# Patient Record
Sex: Female | Born: 1955 | Hispanic: Yes | Marital: Single | Smoking: Former smoker
Health system: Southern US, Community
[De-identification: ages and names within clinical notes are randomized; demographics above are authoritative.]

## PROBLEM LIST (undated history)

## (undated) DIAGNOSIS — J45909 Unspecified asthma, uncomplicated: Secondary | ICD-10-CM

## (undated) DIAGNOSIS — C801 Malignant (primary) neoplasm, unspecified: Secondary | ICD-10-CM

## (undated) DIAGNOSIS — I10 Essential (primary) hypertension: Secondary | ICD-10-CM

## (undated) DIAGNOSIS — N189 Chronic kidney disease, unspecified: Secondary | ICD-10-CM

## (undated) DIAGNOSIS — R7303 Prediabetes: Secondary | ICD-10-CM

## (undated) HISTORY — PX: KIDNEY SURGERY: SHX687

## (undated) HISTORY — PX: ABDOMINAL HYSTERECTOMY: SHX81

## (undated) HISTORY — PX: TOTAL NEPHRECTOMY: SHX415

---

## 2019-07-08 ENCOUNTER — Ambulatory Visit (HOSPITAL_COMMUNITY): Payer: Self-pay | Admitting: Surgery

## 2019-11-13 ENCOUNTER — Other Ambulatory Visit (HOSPITAL_COMMUNITY): Payer: Self-pay | Admitting: Surgery

## 2019-11-13 ENCOUNTER — Other Ambulatory Visit: Payer: Self-pay | Admitting: Surgery

## 2019-11-13 DIAGNOSIS — K432 Incisional hernia without obstruction or gangrene: Secondary | ICD-10-CM

## 2019-11-24 ENCOUNTER — Ambulatory Visit: Payer: Self-pay | Admitting: Surgery

## 2019-11-24 DIAGNOSIS — Z789 Other specified health status: Secondary | ICD-10-CM | POA: Insufficient documentation

## 2019-11-24 DIAGNOSIS — Z905 Acquired absence of kidney: Secondary | ICD-10-CM | POA: Insufficient documentation

## 2019-11-24 DIAGNOSIS — K432 Incisional hernia without obstruction or gangrene: Secondary | ICD-10-CM | POA: Insufficient documentation

## 2019-11-24 DIAGNOSIS — Z8544 Personal history of malignant neoplasm of other female genital organs: Secondary | ICD-10-CM

## 2019-11-24 DIAGNOSIS — E669 Obesity, unspecified: Secondary | ICD-10-CM | POA: Insufficient documentation

## 2019-11-25 ENCOUNTER — Ambulatory Visit: Payer: Self-pay | Admitting: Surgery

## 2019-11-25 NOTE — H&P (Signed)
Jody Fitzgerald Appointment: 07/08/2019 10:15 AM Location: [facility] Patient #: [id number] DOB: [birth year] Divorced / Language: Undefined / Race: Refused to Report/Unreported Female   History of Present Illness Adin Hector MD; 07/08/2019 1:22 PM) The patient is a 64 year old female who presents with abdominal pain. Note for "Abdominal pain": ` ` ` Patient sent for surgical consultation at the request of [name]  Chief Complaint: Abdominal pain had old kidney incision. Concern for hernia. ` ` The patient is a morbidly obese woman. She comes in today by herself. She speaks no [place].. Used a telephone interpreter help sort out. No records were available at first. After calling her office numerous times, able to get some of her records. She mainly been [place]. She had some type of GYN surgery. 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 6 months later she had a right nephrectomy and possible partial hepatectomy. That was done through a right subcostal incision. Patient noted worsening pulling and discomfort while she was visiting her daughter at the [place]. She has been in [place] for 3 years. She had a CAT scan which showed concern of splenic flexure and descending colon going any hernia in the right upper quadrant. No obstruction. She discussed with her primary care physician. Surgical consultation offered. She's had some other abdominal complaints. Had an ultrasound that was underwhelming. Had CT of the chest that showed no metastatic disease. She gets short of breath with exertion. Apparently has some left ventricular hypertrophy. Due to see Dr. Brigitte Pulse with cardiology. Has hypertension. Initially poorly controlled with seems better now. At some point had Covid as by reactive antibody testing last month. Has borderline elevated hemoglobin A1c. She is due to get screening for colon cancer by gastroenterology. History of  asthma and wheezing. She used to smoke but does not smoke anymore. No history of skin infections or wound infections with her is that she can recall. She moves her bowels about every other day. She is not on blood thinners. Can walk about 10-15 minutes before she has to stop. She has not had episodes of nausea or vomiting.  (Review of systems as stated in this history (HPI) or in the review of systems. Otherwise all other 12 point ROS are negative) ` ` `  This patient encounter took 120 minutes today to perform the following: obtain history, perform exam, review outside records, interpret tests & imaging, counsel the patient on their diagnosis; and, document this encounter, including findings & plan in the electronic health record (EHR).   Past Surgical History Illene Fitzgerald, CMA; 07/08/2019 11:17 AM) Hysterectomy (due to cancer) - Complete  Nephrectomy  Right.  Diagnostic Studies History Illene Fitzgerald, CMA; 07/08/2019 11:17 AM) Colonoscopy  >10 years ago Mammogram  >3 years ago Pap Smear  >5 years ago  Allergies Illene Fitzgerald, CMA; 07/08/2019 11:18 AM) No Known Drug Allergies  [07/08/2019]:  Social History Illene Fitzgerald, CMA; 07/08/2019 11:17 AM) Alcohol use  Remotely quit alcohol use. Caffeine use  Coffee. No drug use  Tobacco use  Former smoker.  Family History Illene Fitzgerald, CMA; 07/08/2019 11:17 AM) Diabetes Mellitus  Brother, Family Members In General, Father, Sister. Hypertension  Brother, Mother, Sister. Kidney Disease  Sister.  Pregnancy / Birth History Illene Fitzgerald, CMA; 07/08/2019 11:17 AM) Age at menarche  18 years. Age of menopause  10-50 Gravida  9 Length (months) of breastfeeding  12-24 Maternal age  <15 Para  6 Regular periods   Other  Problems Jody Fitzgerald, CMA; 07/08/2019 11:17 AM) Anxiety Disorder  Asthma  Bladder Problems  Cancer  High blood pressure     Review of Systems (Jody Fitzgerald CMA; 07/08/2019  11:17 AM) General Present- Weight Gain. Not Present- Appetite Loss, Chills, Fatigue, Fever, Night Sweats and Weight Loss. Skin Not Present- Change in Wart/Mole, Dryness, Hives, Jaundice, New Lesions, Non-Healing Wounds, Rash and Ulcer. HEENT Present- Wears glasses/contact lenses. Not Present- Earache, Hearing Loss, Hoarseness, Nose Bleed, Oral Ulcers, Ringing in the Ears, Seasonal Allergies, Sinus Pain, Sore Throat, Visual Disturbances and Yellow Eyes. Respiratory Present- Snoring. Not Present- Bloody sputum, Chronic Cough, Difficulty Breathing and Wheezing. Cardiovascular Not Present- Chest Pain, Difficulty Breathing Lying Down, Leg Cramps, Palpitations, Rapid Heart Rate, Shortness of Breath and Swelling of Extremities. Gastrointestinal Not Present- Abdominal Pain, Bloating, Bloody Stool, Change in Bowel Habits, Chronic diarrhea, Constipation, Difficulty Swallowing, Excessive gas, Gets full quickly at meals, Hemorrhoids, Indigestion, Nausea, Rectal Pain and Vomiting. Female Genitourinary Present- Nocturia and Urgency. Not Present- Frequency, Painful Urination and Pelvic Pain. Musculoskeletal Not Present- Back Pain, Joint Pain, Joint Stiffness, Muscle Pain, Muscle Weakness and Swelling of Extremities. Neurological Not Present- Decreased Memory, Fainting, Headaches, Numbness, Seizures, Tingling, Tremor, Trouble walking and Weakness. Psychiatric Not Present- Anxiety, Bipolar, Change in Sleep Pattern, Depression, Fearful and Frequent crying. Endocrine Present- Cold Intolerance. Not Present- Excessive Hunger, Hair Changes, Heat Intolerance, Hot flashes and New Diabetes. Hematology Present- Blood Thinners. Not Present- Easy Bruising, Excessive bleeding, Gland problems, HIV and Persistent Infections.  Vitals (Jody Fitzgerald CMA; 07/08/2019 11:18 AM) 07/08/2019 11:18 AM Weight: 245 lb Height: 60in Body Surface Area: 2.03 m Body Mass Index: 47.85 kg/m  Pulse: 92 (Regular)  BP: 110/70(Sitting, Left  Arm, Standard)       Physical Exam Adin Hector MD; 07/08/2019 1:12 PM) General Mental Status-Alert. General Appearance-Not in acute distress, Not Sickly. Orientation-Oriented X3. Hydration-Well hydrated. Voice-Normal.  Integumentary Global Assessment Upon inspection and palpation of skin surfaces of the - Axillae: non-tender, no inflammation or ulceration, no drainage. and Distribution of scalp and body hair is normal. General Characteristics Temperature - normal warmth is noted.  Head and Neck Head-normocephalic, atraumatic with no lesions or palpable masses. Face Global Assessment - atraumatic, no absence of expression. Neck Global Assessment - no abnormal movements, no bruit auscultated on the right, no bruit auscultated on the left, no decreased range of motion, non-tender. Trachea-midline. Thyroid Gland Characteristics - non-tender.  Eye Eyeball - Left-Extraocular movements intact, No Nystagmus - Left. Eyeball - Right-Extraocular movements intact, No Nystagmus - Right. Cornea - Left-No Hazy - Left. Cornea - Right-No Hazy - Right. Sclera/Conjunctiva - Left-No scleral icterus, No Discharge - Left. Sclera/Conjunctiva - Right-No scleral icterus, No Discharge - Right. Pupil - Left-Direct reaction to light normal. Pupil - Right-Direct reaction to light normal.  ENMT Ears Pinna - Left - no drainage observed, no generalized tenderness observed. Pinna - Right - no drainage observed, no generalized tenderness observed. Nose and Sinuses External Inspection of the Nose - no destructive lesion observed. Inspection of the nares - Left - quiet respiration. Inspection of the nares - Right - quiet respiration. Mouth and Throat Lips - Upper Lip - no fissures observed, no pallor noted. Lower Lip - no fissures observed, no pallor noted. Nasopharynx - no discharge present. Oral Cavity/Oropharynx - Tongue - no dryness observed. Oral Mucosa - no cyanosis  observed. Hypopharynx - no evidence of airway distress observed.  Chest and Lung Exam Inspection Movements - Normal and Symmetrical. Accessory muscles - No use  of accessory muscles in breathing. Palpation Palpation of the chest reveals - Non-tender. Auscultation Breath sounds - Normal and Clear.  Cardiovascular Auscultation Rhythm - Regular. Murmurs & Other Heart Sounds - Auscultation of the heart reveals - No Murmurs and No Systolic Clicks.  Abdomen Inspection Inspection of the abdomen reveals - No Visible peristalsis and No Abnormal pulsations. Umbilicus - No Bleeding, No Urine drainage. Palpation/Percussion Palpation and Percussion of the abdomen reveal - Soft, Non Tender, No Rebound tenderness, No Rigidity (guarding) and No Cutaneous hyperesthesia. Note: Couple body habitus. Hopefully obese. Right upper flank subcostal incision with some bulging and discomfort had anterior axillary line about 2 cm inferior to the costal ridge. Seems concerning for a incisional hernia. Seems reducible. Hard to tell with her thick abdominal wall Abdomen soft. Not severely distended. No diastasis recti. No other anterior abdominal wall hernias   Female Genitourinary Sexual Maturity Tanner 5 - Adult hair pattern. Note: No vaginal bleeding nor discharge   Peripheral Vascular Upper Extremity Inspection - Left - No Cyanotic nailbeds - Left, Not Ischemic. Inspection - Right - No Cyanotic nailbeds - Right, Not Ischemic.  Neurologic Neurologic evaluation reveals -normal attention span and ability to concentrate, able to name objects and repeat phrases. Appropriate fund of knowledge , normal sensation and normal coordination. Mental Status Affect - not angry, not paranoid. Cranial Nerves-Normal Bilaterally. Gait-Normal.  Neuropsychiatric Mental status exam performed with findings of-able to articulate well with normal speech/language, rate, volume and coherence, thought content normal  with ability to perform basic computations and apply abstract reasoning and no evidence of hallucinations, delusions, obsessions or homicidal/suicidal ideation.  Musculoskeletal Global Assessment Spine, Ribs and Pelvis - no instability, subluxation or laxity. Right Upper Extremity - no instability, subluxation or laxity.  Lymphatic Head & Neck  General Head & Neck Lymphatics: Bilateral - Description - No Localized lymphadenopathy. Axillary  General Axillary Region: Bilateral - Description - No Localized lymphadenopathy. Femoral & Inguinal  Generalized Femoral & Inguinal Lymphatics: Left - Description - No Localized lymphadenopathy. Right - Description - No Localized lymphadenopathy.    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  As best I can gather, she most likely has an incisional hernia involving right nephrectomy incision. It does seem reducible but hard to tell.  There is a CAT scan reports that talks about incisional hernia.  Report talks about the splenic flexure and descending colon is in this right flank hernia. That does not make sense.  Vital long discussion with the patient through an interpreter. We call her primary care office many times. Took 2-1/2 hours to work through all this and still insufficient.  After explaining many 3 due to interpreters, tried to convince the patient to help [place] by getting records. She insists that primary office has all her records from when she was up and [place]. He will be variable to have that. He did not why she had a nephrectomy and if she is cancer free.  I also need to have the actual images. It seems like she did have a CT of the actual CAT scan from [place]. I tried to go through care everywhere, but there are no records available. I need to strictures. Otherwise, we need to  repeat the CT scan of the abdomen. She's had an ultrasound and CT chest on her underwhelming. No mention of hernia as opposed to the 1 in [place] that noted it.  Should she have an incisional hernia that involves colon, she may benefit from surgery to repair this. Laparoscopic approach with the decubitus positioning. However her morbid obesity makes risk of recurrence rather high. A hold off on any aggressive intervention given the morbidity. Would want medical probably cardiac clearance first.  After very long discussion I gave her a handout that talks about incisional hernias in Spanish. We strongly recommend that she have her daughter with her to help interpret and support her through this. Again strongly recommend that the patient can get all her records for [place] to help sort out what is going on. Current Plans Follow Up - Call [facility] office after tests / studies doneto discuss further plans Pt Education - Pamphlet Given - Hernia Surgery: discussed with patient and provided information. USES SPANISH AS PRIMARY SPOKEN LANGUAGE (Z78.9) Impression: Spent over 75 minutes using an interpreter talking with the patient. 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 She describes kidney stones getting so large that part of the liver had to be removed. The sounds more like a renal cell that to me. Another possibility is need for right nephrectomy after hysterectomy for cancer due to ureteral stricture/injury. Copy of operative warts essential to sort out what happened. 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  Looks like her vaginal cancer and kidney resections were over a decade  ago.  While I do not have the actual films, there is a report from earlier this year have a CAT scan showing incisional hernia along right nephrectomy incision.  No obvious cancer recurrence.  Therefore I can offer incisional hernia repair.  Laparoscopic approach.  Most likely will need a large sheet of mesh given her obesity a moderately large-sized defect.  Most likely overnight stay at the very least.  I think he would be very helpful to have family with her especially since I believe her children or niece is bilingual.  I will help facilitate communication.  I Have the patient and family come to the office to discuss surgery again if they wish.  I noted the patient's been strongly motivated to get surgery done.  > From: Illene Fitzgerald CMA > To: Adin Hector MD > Sent: 11/24/2019 10:01 AM > Dr Johney Maine has reviewed the outside rec's from [facility] and states that we can proceed with getting the pt scheduled for surgery now for the incisional hernia repair. Per Dr Johney Maine the pt doesn't need a repeat CT scan that we had ordered since now reviewing the rec's the pt had a recent CT this year. The son Mallie Mussel has been notified and is aware that we are going to turn sx orders into scheduling w/his name on the sx orders as a contact to schedule the pt's sx. The son Mallie Mussel is aware.  > From: Illene Fitzgerald CMA > To: Illene Fitzgerald CMA > Sent: 11/21/2019 9:04 AM > Received rec's from [facility] by mail will show to DR Lailoni Baquera on Monday for review then will decide on plan.  > From: Illene Fitzgerald CMA > To: Illene Fitzgerald CMA > Sent: 11/13/2019 3:56 PM > Charlene w/[facility] [phone] (  [phone]) returned my call. I explained the situation to her and she is going to try to help get [place] the rec's. Dr Johney Maine was sitting beside me while I was on the phone explaining how hard this has been trying to get rec's on this referral from their office that was sent over in  June. Randell Patient is going to mail me o.v. notes, CT reports, and what info she see's from [place] that the pt brought in to their office. I requested for her to mail the information to my attn here at [place].  > From: Illene Fitzgerald CMA > To: Illene Fitzgerald CMA > Sent: 11/13/2019 2:51 PM > Left another message w/DR Ronnald Ramp office asking for the supervisor to call me back or someone. We leave messages and no one calls [place] back. stating that i have been trying to get rec's from their office for months now on this pt that they referred to Dr Johney Maine several months a go we can't give a surgical recommendation till they give [place] her rec's from there. The pt is calling our office upset b/c she is wanting to know if she needs sx or not. We have tried multiple times calling [facility] and the pt has even tried herself. We need all rec's from Dr Ronnald Ramp, CT report, and outside rec's that has any operative reports that may help explain what other surgeries the pt has had done.  > From: Illene Fitzgerald CMA > To: Illene Fitzgerald CMA > Sent: 11/12/2019 9:53 AM > Called pt's son again to advise him that we have tried several times reaching out to [facility] about getting the pt's rec's that pertain to the referral that was sent over here on the incisional hernia. We have tried explaining to his mom also that Dr Johney Maine is needing more information in order to give her a surgical recommendation. I know the pt has been calling wanting to get scheduled for sx but I explained that Dr Johney Maine has not given the pt a surgical opinion yet b/c she is complicated. I advised that I know the pt has brought some rec's into the office but they are not from the pcp like we have advised. I know the pt has been cleared by cardiology but it's not cardiology rec's that we need it's the pcp Dr Ronnald Ramp. I advised that Dr Johney Maine did say that we could just order another CT scan on the pt to see about this incisional hernia. I did  place a CT A/P w/contrast and marked for them to call the son Mallie Mussel since the pt speaks spanish. Mallie Mussel understands.  > From: Oretha Ellis  > To: Illene Fitzgerald CMA > Sent: 11/06/2019 12:17 PM > Pt's son, Mallie Mussel, returned call. Please call back at your convenience. Thank you, Rosendo Gros  > From: Illene Fitzgerald CMA > To: Illene Fitzgerald CMA > Sent: 11/06/2019 9:15 AM > LMOm for pt's son Mallie Mussel to call me back.  > From: Jackquline Denmark  > To: Illene Fitzgerald CMA > Sent: 11/06/2019 8:20 AM > sent this to myself  > From: Jackquline Denmark  > To: Jackquline Denmark  > Sent: 11/05/2019 4:57 PM > Mallie Mussel Mike's phone#[phone]  > From: Jackquline Denmark  > To: Illene Fitzgerald CMA > Sent: 11/05/2019 4:55 PM > Pt's son, Getsemani Lindon, calling to schedule his mother's sx, please call.  > From: Illene Fitzgerald CMA > To: Illene Fitzgerald CMA > Sent: 10/27/2019 10:00 AM > Called pt's pcp Dr  Jones office spoke to [place] about what we are needing on this pt in order to get the pt a surgical opinion. I advised Tiffany that we need any of their office notes, CT reports, and any outside rec's like operative reports that would help explain some of the pt's medical hx. Tiffany will try to work on this today and either email the rec's vs. fax them to [place].   I asked for Earnest Bailey to call the pt to give her a update on what we are working on in the time being. Pt is aware.  > From: Illene Fitzgerald CMA > To: Illene Fitzgerald CMA > Sent: 10/24/2019 3:36 PM > LMom w/[facility] to call me back. It's very important that I speak w/someone over there to explain what rec's Dr Johney Maine is needing for this pt.  > From: Jackquline Denmark  > To: Illene Fitzgerald CMA > Sent: 10/24/2019 3:06 PM > Pt dropped off records from [place], [place] made copies, they are up at the front desk.  > From: Illene Fitzgerald CMA > To: Illene Fitzgerald CMA > Sent: 10/23/2019 11:11 AM > Returned call to [place] but had to [facility] for her to  call me back. I need to speak w/Carmen when she calls back.  > From: Jackquline Denmark  > To: Illene Fitzgerald CMA > Sent: 10/23/2019 8:54 AM > Asencion Partridge, [facility], called regarding this pt, she states the pt called and told our office has not received the clearance for her sx, you can reach her at [phone].   > From: Illene Fitzgerald CMA > To: Illene Fitzgerald CMA > Sent: 10/22/2019 3:20 PM > Earnest Bailey w/our office called pt to let her know the recommendations below by Dr Johney Maine. Earnest Bailey explained to the pt that she needs to get the rec's from [facility] and her outside rec's from [place] if pt wishes to have a surgical evaluation. Pt advised that when she gets the rec's sent to [place] then we can move forward with some of the other request listed below. Pt is aware.  > From: Adin Hector MD > To: Illene Fitzgerald CMA > Sent: 10/20/2019 12:39 PM > While the patient is insistent on getting surgery, that doesn't mean that I'm going to consider it until the workup is complete.  While we have a cardiac clearance note from [facility], I think it would be wise to have a copy of their echocardiogram report from June 2021.  We still have no records from the primary care physician or from [place] that would help explain why she had kidney and GYN surgery.  I need records from her prior surgeries up in [place].  The patient has insisted that [facility] has this.  We have not gotten those records.  Would like to actually see the operative reports to know what was done.  Sounds like she had a hysterectomy and then a nephrectomy.  I have reviewed the CT scan.  I see no report.  Just the disc.  It would be nice to know what the official read is.  I cannot pull it up in the PACS system to work around that.  The CT scan is of the chest only.  I can see the upper part of a right flank hernia that seems to correlate with her prior right nephrectomy, but it is not  complete.  I think the patient will benefit from a CT scan of the abdomen and pelvis with  oral and IV contrast to help delineate the full hernia for possible planning.    Would be helpful to make sure that she has no metastatic disease or other issues since she's had 2 surgeries for GYN and renal tumors (?cancers).    It would be helpful to know from primary care office if she's had any follow-up with medical oncology and they are not concerns about recurrence or need for chemotherapy, etc.  If the CAT scan shows no surprises, she does not require any further oncology follow-up/chemotherapy, we get records of last PCP visit, copies of her prior surgeries up in [place]; then, we can offer her to return to the office with family to discuss surgery repair with a family member there since she speaks no [place].  Suspect she will need a large sheet of mesh given her obesity and the size of this hernia.  If this is not acceptable to the patient, she can get a opinion with someone else  > From: Illene Fitzgerald CMA > To: Adin Hector MD > Sent: 10/17/2019 3:56 PM > Please advise.  We did get a cd mailed to the office today for you to review of CT chest.  > From: Cipriano Mile  > To: Illene Fitzgerald CMA > Sent: 10/17/2019 12:34 PM > Pt is calling to schedule sx.  I don't have any orders.

## 2020-02-05 ENCOUNTER — Encounter (HOSPITAL_COMMUNITY): Payer: Self-pay

## 2020-02-05 NOTE — Patient Instructions (Addendum)
DUE TO COVID-19 ONLY ONE VISITOR IS ALLOWED TO COME WITH YOU AND STAY IN THE WAITING ROOM ONLY DURING PRE OP AND PROCEDURE DAY OF SURGERY. THE 1 VISITOR  MAY VISIT WITH YOU AFTER SURGERY IN YOUR PRIVATE ROOM DURING VISITING HOURS ONLY!  YOU NEED TO HAVE A COVID 19 TEST ON__1/15_____ @12 :00 PM_______, THIS TEST MUST BE DONE BEFORE SURGERY,  COVID TESTING SITE [address] [place] , IT IS ON THE RIGHT GOING OUT [address] APPROXIMATELY  2 MINUTES PAST [employer] ON THE RIGHT. ONCE YOUR COVID TEST IS COMPLETED,  PLEASE BEGIN THE QUARANTINE INSTRUCTIONS AS OUTLINED IN YOUR HANDOUT.                Jody Fitzgerald    Your procedure is scheduled on: 02/18/20   Report to [facility] Main  Entrance   Report to admitting at 11:00 AM     Call this number if you have problems the morning of surgery [phone]    BRUSH YOUR TEETH MORNING OF SURGERY AND RINSE YOUR MOUTH OUT, NO CHEWING GUM CANDY OR MINTS.  DRINK 2 PRESURGERY ENSURE DRINKS THE NIGHT BEFORE SURGERY AT  1000 PM AND 1 PRESURGERY DRINK THE DAY OF THE PROCEDURE 3 HOURS PRIOR TO SCHEDULED SURGERY. NO SOLIDS AFTER MIDNIGHT THE DAY PRIOR TO THE SURGERY. NOTHING BY MOUTH EXCEPT CLEAR LIQUIDS UNTIL THREE HOURS PRIOR TO SCHEDULED SURGERY. PLEASE FINISH PRESURGERY ENSURE DRINK PER SURGEON ORDER 3 HOURS PRIOR TO SCHEDULED SURGERY TIME WHICH NEEDS TO BE COMPLETED AT: 10:00 AM.   No food after midnight.    You may have clear liquid until 10:00 AM.    CLEAR LIQUID DIET   Foods Allowed                                                                     Foods Excluded  Coffee and tea, regular and decaf                             liquids that you cannot  Plain Jell-O any favor except red or purple                                           see through such as: Fruit ices (not with fruit pulp)                                     milk, soups, orange juice  Iced Popsicles                                     All solid food Carbonated beverages, regular and diet                                    Cranberry, grape and apple juices Sports drinks like Gatorade Lightly seasoned clear broth or  consume(fat free) Sugar, honey syrup   Nothing by mouth after 10:00 AM.    Take these medicines the morning of surgery with A SIP OF WATER: Amlodipine                               You may not have any metal on your body including hair pins and              piercings  Do not wear jewelry, make-up, lotions, powders or perfumes, deodorant             Do not wear nail polish on your fingernails.  Do not shave  48 hours prior to surgery.             .   Do not bring valuables to the hospital. [facility] IS NOT             RESPONSIBLE   FOR VALUABLES.  Contacts, dentures or bridgework may not be worn into surgery.       Patients discharged the day of surgery will not be allowed to drive home.  IF YOU ARE HAVING SURGERY AND GOING HOME THE SAME DAY, YOU MUST HAVE AN ADULT TO DRIVE YOU HOME AND BE WITH YOU FOR 24 HOURS. YOU MAY GO HOME BY TAXI OR UBER OR ORTHERWISE, BUT AN ADULT MUST ACCOMPANY YOU HOME AND STAY WITH YOU FOR 24 HOURS.  Name and phone number of your driver:  Special Instructions: N/A              Please read over the following fact sheets you were given: _____________________________________________________________________         [facility] - Preparing for Surgery Before surgery, you can play an important role.   Because skin is not sterile, your skin needs to be as free of germs as possible.   You can reduce the number of germs on your skin by washing with CHG (chlorahexidine gluconate) soap before surgery.   CHG is an antiseptic cleaner which kills germs and bonds with the skin to continue killing germs even after washing. Please DO NOT use if you have an allergy to CHG or antibacterial soaps.   If your skin becomes reddened/irritated stop using the CHG and inform your nurse when you  arrive at Short Stay. Do not shave (including legs and underarms) for at least 48 hours prior to the first CHG shower.  Please follow these instructions carefully:  1.  Shower with CHG Soap the night before surgery and the  morning of Surgery.  2.  If you choose to wash your hair, wash your hair first as usual with your  normal  shampoo.  3.  After you shampoo, rinse your hair and body thoroughly to remove the  shampoo.                                        4.  Use CHG as you would any other liquid soap.  You can apply chg directly  to the skin and wash                       Gently with a scrungie or clean washcloth.  5.  Apply the CHG Soap to your body ONLY FROM THE NECK DOWN.   Do not use on face/  open                           Wound or open sores. Avoid contact with eyes, ears mouth and genitals (private parts).                       Wash face,  Genitals (private parts) with your normal soap.             6.  Wash thoroughly, paying special attention to the area where your surgery  will be performed.  7.  Thoroughly rinse your body with warm water from the neck down.  8.  DO NOT shower/wash with your normal soap after using and rinsing off  the CHG Soap.             9.  Pat yourself dry with a clean towel.            10.  Wear clean pajamas.            11.  Place clean sheets on your bed the night of your first shower and do not  sleep with pets. Day of Surgery : Do not apply any lotions/deodorants the morning of surgery.  Please wear clean clothes to the hospital/surgery center.  FAILURE TO FOLLOW THESE INSTRUCTIONS MAY RESULT IN THE CANCELLATION OF YOUR SURGERY PATIENT SIGNATURE_________________________________  NURSE SIGNATURE__________________________________  ________________________________________________________________________

## 2020-02-09 ENCOUNTER — Encounter (HOSPITAL_COMMUNITY): Payer: Self-pay

## 2020-02-09 ENCOUNTER — Encounter (HOSPITAL_COMMUNITY)
Admission: RE | Admit: 2020-02-09 | Discharge: 2020-02-09 | Disposition: A | Discharge status: Home / Self Care | Payer: Medicare HMO | Source: Ambulatory Visit | Attending: Surgery | Admitting: Surgery

## 2020-02-09 ENCOUNTER — Other Ambulatory Visit: Payer: Self-pay

## 2020-02-09 DIAGNOSIS — Z01818 Encounter for other preprocedural examination: Secondary | ICD-10-CM | POA: Diagnosis present

## 2020-02-09 DIAGNOSIS — I10 Essential (primary) hypertension: Secondary | ICD-10-CM | POA: Diagnosis not present

## 2020-02-09 HISTORY — DX: Unspecified asthma, uncomplicated: J45.909

## 2020-02-09 HISTORY — DX: Chronic kidney disease, unspecified: N18.9

## 2020-02-09 HISTORY — DX: Prediabetes: R73.03

## 2020-02-09 HISTORY — DX: Malignant (primary) neoplasm, unspecified: C80.1

## 2020-02-09 HISTORY — DX: Essential (primary) hypertension: I10

## 2020-02-09 LAB — BASIC METABOLIC PANEL
Anion gap: 8 (ref 5–15)
BUN: 15 mg/dL (ref 8–23)
CO2: 28 mmol/L (ref 22–32)
Calcium: 9 mg/dL (ref 8.9–10.3)
Chloride: 106 mmol/L (ref 98–111)
Creatinine, Ser: 1.03 mg/dL — ABNORMAL HIGH (ref 0.44–1.00)
GFR, Estimated: >60 mL/min (ref >60)
Glucose, Bld: 87 mg/dL (ref 70–99)
Potassium: 4.2 mmol/L (ref 3.5–5.1)
Sodium: 142 mmol/L (ref 135–145)

## 2020-02-09 LAB — CBC
HCT: 44.2 % (ref 36.0–46.0)
Hemoglobin: 14.3 g/dL (ref 12.0–15.0)
MCH: 31.4 pg (ref 26.0–34.0)
MCHC: 32.4 g/dL (ref 30.0–36.0)
MCV: 96.9 fL (ref 80.0–100.0)
Platelets: 323 10*3/uL (ref 150–400)
RBC: 4.56 MIL/uL (ref 3.87–5.11)
RDW: 13 % (ref 11.5–15.5)
WBC: 6.8 10*3/uL (ref 4.0–10.5)
nRBC: 0 % (ref 0.0–0.2)

## 2020-02-09 LAB — HEMOGLOBIN A1C
Hgb A1c MFr Bld: 5.5 % (ref 4.8–5.6)
Mean Plasma Glucose: 111.15 mg/dL

## 2020-02-09 NOTE — Progress Notes (Addendum)
COVID Vaccine Completed: NO Date COVID Vaccine completed: COVID vaccine manufacturer: [employer]   PCP - Dr. Dola Argyle at [place] - NO  Chest x-ray -  EKG -  Stress Test -  ECHO -  Cardiac Cath -  Pacemaker/ICD device last checked:  Sleep Study -  CPAP -   Fasting Blood Sugar -  Checks Blood Sugar _____ times a day  Blood Thinner Instructions: Aspirin Instructions: Last Dose:  Anesthesia review: Hx: HTN  Patient denies shortness of breath, fever, cough and chest pain at PAT appointment   Patient verbalized understanding of instructions that were given to them at the PAT appointment. Patient was also instructed that they will need to review over the PAT instructions again at home before surgery.

## 2020-02-14 ENCOUNTER — Other Ambulatory Visit (HOSPITAL_COMMUNITY)
Admission: RE | Admit: 2020-02-14 | Discharge: 2020-02-14 | Disposition: A | Discharge status: Home / Self Care | Payer: Medicare HMO | Source: Ambulatory Visit | Attending: Surgery | Admitting: Surgery

## 2020-02-14 DIAGNOSIS — Z01812 Encounter for preprocedural laboratory examination: Secondary | ICD-10-CM | POA: Diagnosis present

## 2020-02-14 DIAGNOSIS — Z20822 Contact with and (suspected) exposure to covid-19: Secondary | ICD-10-CM | POA: Diagnosis not present

## 2020-02-14 LAB — SARS CORONAVIRUS 2 (TAT 6-24 HRS): SARS Coronavirus 2: NEGATIVE

## 2020-02-17 MED ORDER — BUPIVACAINE LIPOSOME 1.3 % IJ SUSP
20.0000 mL | Freq: Once | INTRAMUSCULAR | Status: DC
  Filled 2020-02-17: qty 20

## 2020-02-18 ENCOUNTER — Encounter (HOSPITAL_COMMUNITY): Payer: Self-pay | Admitting: Surgery

## 2020-02-18 ENCOUNTER — Encounter (HOSPITAL_COMMUNITY)
Admission: RE | Disposition: A | Discharge status: Home / Self Care | Payer: Self-pay | Source: Ambulatory Visit | Attending: Surgery

## 2020-02-18 ENCOUNTER — Other Ambulatory Visit: Payer: Self-pay

## 2020-02-18 ENCOUNTER — Ambulatory Visit (HOSPITAL_COMMUNITY): Payer: Medicare HMO | Admitting: Anesthesiology

## 2020-02-18 ENCOUNTER — Observation Stay (HOSPITAL_COMMUNITY)
Admission: RE | Admit: 2020-02-18 | Discharge: 2020-02-19 | Disposition: A | Discharge status: Home / Self Care | Payer: Medicare HMO | Source: Ambulatory Visit | Attending: Surgery | Admitting: Surgery

## 2020-02-18 DIAGNOSIS — Z20822 Contact with and (suspected) exposure to covid-19: Secondary | ICD-10-CM | POA: Diagnosis not present

## 2020-02-18 DIAGNOSIS — M25551 Pain in right hip: Secondary | ICD-10-CM | POA: Diagnosis present

## 2020-02-18 DIAGNOSIS — Z886 Allergy status to analgesic agent status: Secondary | ICD-10-CM

## 2020-02-18 DIAGNOSIS — K760 Fatty (change of) liver, not elsewhere classified: Secondary | ICD-10-CM | POA: Diagnosis present

## 2020-02-18 DIAGNOSIS — Z905 Acquired absence of kidney: Secondary | ICD-10-CM

## 2020-02-18 DIAGNOSIS — J9601 Acute respiratory failure with hypoxia: Secondary | ICD-10-CM | POA: Diagnosis not present

## 2020-02-18 DIAGNOSIS — N179 Acute kidney failure, unspecified: Secondary | ICD-10-CM | POA: Diagnosis present

## 2020-02-18 DIAGNOSIS — K432 Incisional hernia without obstruction or gangrene: Secondary | ICD-10-CM | POA: Insufficient documentation

## 2020-02-18 DIAGNOSIS — Z87891 Personal history of nicotine dependence: Secondary | ICD-10-CM | POA: Insufficient documentation

## 2020-02-18 DIAGNOSIS — N183 Chronic kidney disease, stage 3 unspecified: Secondary | ICD-10-CM | POA: Diagnosis present

## 2020-02-18 DIAGNOSIS — J45909 Unspecified asthma, uncomplicated: Secondary | ICD-10-CM | POA: Diagnosis present

## 2020-02-18 DIAGNOSIS — Z8249 Family history of ischemic heart disease and other diseases of the circulatory system: Secondary | ICD-10-CM

## 2020-02-18 DIAGNOSIS — E662 Morbid (severe) obesity with alveolar hypoventilation: Secondary | ICD-10-CM | POA: Diagnosis present

## 2020-02-18 DIAGNOSIS — R946 Abnormal results of thyroid function studies: Secondary | ICD-10-CM | POA: Diagnosis present

## 2020-02-18 DIAGNOSIS — Z602 Problems related to living alone: Secondary | ICD-10-CM | POA: Diagnosis present

## 2020-02-18 DIAGNOSIS — E669 Obesity, unspecified: Secondary | ICD-10-CM | POA: Diagnosis present

## 2020-02-18 DIAGNOSIS — T402X1A Poisoning by other opioids, accidental (unintentional), initial encounter: Principal | ICD-10-CM | POA: Diagnosis present

## 2020-02-18 DIAGNOSIS — K5900 Constipation, unspecified: Secondary | ICD-10-CM | POA: Diagnosis present

## 2020-02-18 DIAGNOSIS — Z789 Other specified health status: Secondary | ICD-10-CM | POA: Diagnosis present

## 2020-02-18 DIAGNOSIS — I129 Hypertensive chronic kidney disease with stage 1 through stage 4 chronic kidney disease, or unspecified chronic kidney disease: Secondary | ICD-10-CM | POA: Diagnosis present

## 2020-02-18 DIAGNOSIS — J9602 Acute respiratory failure with hypercapnia: Secondary | ICD-10-CM | POA: Diagnosis not present

## 2020-02-18 DIAGNOSIS — Z8542 Personal history of malignant neoplasm of other parts of uterus: Secondary | ICD-10-CM | POA: Insufficient documentation

## 2020-02-18 DIAGNOSIS — R739 Hyperglycemia, unspecified: Secondary | ICD-10-CM | POA: Diagnosis present

## 2020-02-18 DIAGNOSIS — K458 Other specified abdominal hernia without obstruction or gangrene: Secondary | ICD-10-CM

## 2020-02-18 DIAGNOSIS — Z6841 Body Mass Index (BMI) 40.0 and over, adult: Secondary | ICD-10-CM

## 2020-02-18 DIAGNOSIS — F419 Anxiety disorder, unspecified: Secondary | ICD-10-CM | POA: Diagnosis present

## 2020-02-18 DIAGNOSIS — Z79899 Other long term (current) drug therapy: Secondary | ICD-10-CM

## 2020-02-18 DIAGNOSIS — R7303 Prediabetes: Secondary | ICD-10-CM | POA: Diagnosis present

## 2020-02-18 DIAGNOSIS — Z8544 Personal history of malignant neoplasm of other female genital organs: Secondary | ICD-10-CM

## 2020-02-18 DIAGNOSIS — Z833 Family history of diabetes mellitus: Secondary | ICD-10-CM

## 2020-02-18 DIAGNOSIS — D62 Acute posthemorrhagic anemia: Secondary | ICD-10-CM | POA: Diagnosis not present

## 2020-02-18 DIAGNOSIS — Z885 Allergy status to narcotic agent status: Secondary | ICD-10-CM

## 2020-02-18 DIAGNOSIS — Z8541 Personal history of malignant neoplasm of cervix uteri: Secondary | ICD-10-CM

## 2020-02-18 DIAGNOSIS — Y92239 Unspecified place in hospital as the place of occurrence of the external cause: Secondary | ICD-10-CM | POA: Diagnosis present

## 2020-02-18 DIAGNOSIS — Z6839 Body mass index (BMI) 39.0-39.9, adult: Secondary | ICD-10-CM | POA: Insufficient documentation

## 2020-02-18 DIAGNOSIS — E785 Hyperlipidemia, unspecified: Secondary | ICD-10-CM | POA: Diagnosis present

## 2020-02-18 DIAGNOSIS — Y92009 Unspecified place in unspecified non-institutional (private) residence as the place of occurrence of the external cause: Secondary | ICD-10-CM

## 2020-02-18 HISTORY — PX: VENTRAL HERNIA REPAIR: SHX424

## 2020-02-18 LAB — CBC
HCT: 43 % (ref 36.0–46.0)
Hemoglobin: 13.4 g/dL (ref 12.0–15.0)
MCH: 31 pg (ref 26.0–34.0)
MCHC: 31.2 g/dL (ref 30.0–36.0)
MCV: 99.5 fL (ref 80.0–100.0)
Platelets: 240 10*3/uL (ref 150–400)
RBC: 4.32 MIL/uL (ref 3.87–5.11)
RDW: 12.8 % (ref 11.5–15.5)
WBC: 11.7 10*3/uL — ABNORMAL HIGH (ref 4.0–10.5)
nRBC: 0 % (ref 0.0–0.2)

## 2020-02-18 LAB — CREATININE, SERUM
Creatinine, Ser: 1.13 mg/dL — ABNORMAL HIGH (ref 0.44–1.00)
GFR, Estimated: 54 mL/min — ABNORMAL LOW (ref >60)

## 2020-02-18 SURGERY — REPAIR, HERNIA, VENTRAL, LAPAROSCOPIC
Anesthesia: General | Site: Abdomen | Laterality: Right

## 2020-02-18 MED ORDER — FENTANYL CITRATE (PF) 100 MCG/2ML IJ SOLN
INTRAMUSCULAR | Status: AC
  Filled 2020-02-18: qty 2

## 2020-02-18 MED ORDER — ACETAMINOPHEN 500 MG PO TABS
1000.0000 mg | ORAL_TABLET | Freq: Four times a day (QID) | ORAL | Status: DC
  Administered 2020-02-18 – 2020-02-19 (×2): 1000 mg via ORAL
  Filled 2020-02-18 (×2): qty 2

## 2020-02-18 MED ORDER — OXYCODONE HCL 5 MG PO TABS: 5.0000 mg | ORAL_TABLET | Freq: Once | ORAL | Status: DC | PRN

## 2020-02-18 MED ORDER — LIDOCAINE 2% (20 MG/ML) 5 ML SYRINGE
INTRAMUSCULAR | Status: DC | PRN
  Administered 2020-02-18: 80 mg via INTRAVENOUS

## 2020-02-18 MED ORDER — LACTATED RINGERS IV SOLN: INTRAVENOUS | Status: DC

## 2020-02-18 MED ORDER — CEFAZOLIN SODIUM-DEXTROSE 2-4 GM/100ML-% IV SOLN
2.0000 g | Freq: Three times a day (TID) | INTRAVENOUS | Status: AC
  Administered 2020-02-18: 2 g via INTRAVENOUS
  Filled 2020-02-18: qty 100

## 2020-02-18 MED ORDER — PROPOFOL 10 MG/ML IV BOLUS
INTRAVENOUS | Status: DC | PRN
  Administered 2020-02-18: 100 mg via INTRAVENOUS

## 2020-02-18 MED ORDER — LIDOCAINE 2% (20 MG/ML) 5 ML SYRINGE
INTRAMUSCULAR | Status: DC | PRN
Start: 2020-02-18 — End: 2020-02-18
  Administered 2020-02-18: 1.5 mg/kg/h via INTRAVENOUS

## 2020-02-18 MED ORDER — SODIUM CHLORIDE 0.9 % IV SOLN: 250.0000 mL | INTRAVENOUS | Status: DC | PRN

## 2020-02-18 MED ORDER — GABAPENTIN 300 MG PO CAPS
300.0000 mg | ORAL_CAPSULE | ORAL | Status: AC
  Administered 2020-02-18: 300 mg via ORAL
  Filled 2020-02-18: qty 1

## 2020-02-18 MED ORDER — LIP MEDEX EX OINT
1.0000 "application " | TOPICAL_OINTMENT | Freq: Two times a day (BID) | CUTANEOUS | Status: DC
  Administered 2020-02-18 – 2020-02-19 (×2): 1 via TOPICAL
  Filled 2020-02-18: qty 7

## 2020-02-18 MED ORDER — SODIUM CHLORIDE 0.9% FLUSH: 3.0000 mL | Freq: Two times a day (BID) | INTRAVENOUS | Status: DC

## 2020-02-18 MED ORDER — ONDANSETRON HCL 4 MG/2ML IJ SOLN
INTRAMUSCULAR | Status: DC | PRN
  Administered 2020-02-18: 4 mg via INTRAVENOUS

## 2020-02-18 MED ORDER — BUPIVACAINE LIPOSOME 1.3 % IJ SUSP
INTRAMUSCULAR | Status: DC | PRN
  Administered 2020-02-18: 20 mL

## 2020-02-18 MED ORDER — BUPIVACAINE-EPINEPHRINE (PF) 0.25% -1:200000 IJ SOLN
INTRAMUSCULAR | Status: AC
  Filled 2020-02-18: qty 30

## 2020-02-18 MED ORDER — DEXAMETHASONE SODIUM PHOSPHATE 10 MG/ML IJ SOLN
INTRAMUSCULAR | Status: DC | PRN
  Administered 2020-02-18: 4 mg via INTRAVENOUS

## 2020-02-18 MED ORDER — BISACODYL 10 MG RE SUPP: 10.0000 mg | Freq: Every day | RECTAL | Status: DC | PRN

## 2020-02-18 MED ORDER — ROCURONIUM BROMIDE 10 MG/ML (PF) SYRINGE
PREFILLED_SYRINGE | INTRAVENOUS | Status: DC | PRN
  Administered 2020-02-18 (×2): 10 mg via INTRAVENOUS
  Administered 2020-02-18: 60 mg via INTRAVENOUS

## 2020-02-18 MED ORDER — OXYCODONE HCL 5 MG/5ML PO SOLN: 5.0000 mg | Freq: Once | ORAL | Status: DC | PRN

## 2020-02-18 MED ORDER — SUGAMMADEX SODIUM 500 MG/5ML IV SOLN
INTRAVENOUS | Status: AC
  Filled 2020-02-18: qty 5

## 2020-02-18 MED ORDER — MIDAZOLAM HCL 5 MG/5ML IJ SOLN
INTRAMUSCULAR | Status: DC | PRN
  Administered 2020-02-18: 2 mg via INTRAVENOUS

## 2020-02-18 MED ORDER — MAGNESIUM HYDROXIDE 400 MG/5ML PO SUSP: 30.0000 mL | Freq: Every day | ORAL | Status: DC | PRN

## 2020-02-18 MED ORDER — CHLORHEXIDINE GLUCONATE CLOTH 2 % EX PADS: 6.0000 | MEDICATED_PAD | Freq: Once | CUTANEOUS | Status: DC

## 2020-02-18 MED ORDER — ALBUTEROL SULFATE (2.5 MG/3ML) 0.083% IN NEBU: 2.5000 mg | INHALATION_SOLUTION | Freq: Four times a day (QID) | RESPIRATORY_TRACT | Status: DC | PRN

## 2020-02-18 MED ORDER — SIMETHICONE 80 MG PO CHEW
40.0000 mg | CHEWABLE_TABLET | Freq: Four times a day (QID) | ORAL | Status: DC | PRN
  Administered 2020-02-19: 40 mg via ORAL
  Filled 2020-02-18: qty 1

## 2020-02-18 MED ORDER — TRAMADOL HCL 50 MG PO TABS
50.0000 mg | ORAL_TABLET | Freq: Four times a day (QID) | ORAL | Status: DC | PRN
  Administered 2020-02-19: 50 mg via ORAL
  Filled 2020-02-18: qty 1

## 2020-02-18 MED ORDER — GABAPENTIN 300 MG PO CAPS
300.0000 mg | ORAL_CAPSULE | Freq: Two times a day (BID) | ORAL | Status: DC
  Administered 2020-02-18 – 2020-02-19 (×2): 300 mg via ORAL
  Filled 2020-02-18 (×2): qty 1

## 2020-02-18 MED ORDER — ENSURE PRE-SURGERY PO LIQD
296.0000 mL | Freq: Once | ORAL | Status: DC
  Filled 2020-02-18: qty 296

## 2020-02-18 MED ORDER — AMLODIPINE BESYLATE 5 MG PO TABS
5.0000 mg | ORAL_TABLET | Freq: Every day | ORAL | Status: DC
  Administered 2020-02-19: 5 mg via ORAL
  Filled 2020-02-18: qty 1

## 2020-02-18 MED ORDER — DIPHENHYDRAMINE HCL 12.5 MG/5ML PO ELIX: 12.5000 mg | ORAL_SOLUTION | Freq: Four times a day (QID) | ORAL | Status: DC | PRN

## 2020-02-18 MED ORDER — PHENYLEPHRINE HCL-NACL 10-0.9 MG/250ML-% IV SOLN
INTRAVENOUS | Status: DC | PRN
Start: 2020-02-18 — End: 2020-02-18
  Administered 2020-02-18: 40 ug/min via INTRAVENOUS

## 2020-02-18 MED ORDER — CEFAZOLIN SODIUM-DEXTROSE 2-4 GM/100ML-% IV SOLN
2.0000 g | INTRAVENOUS | Status: AC
  Administered 2020-02-18: 2 g via INTRAVENOUS
  Filled 2020-02-18: qty 100

## 2020-02-18 MED ORDER — ATORVASTATIN CALCIUM 40 MG PO TABS
40.0000 mg | ORAL_TABLET | Freq: Every day | ORAL | Status: DC
  Administered 2020-02-19: 11:00:00 40 mg via ORAL
  Filled 2020-02-18: qty 1

## 2020-02-18 MED ORDER — CHLORHEXIDINE GLUCONATE 0.12 % MT SOLN
15.0000 mL | Freq: Once | OROMUCOSAL | Status: AC
  Administered 2020-02-18: 15 mL via OROMUCOSAL

## 2020-02-18 MED ORDER — LISINOPRIL 20 MG PO TABS
20.0000 mg | ORAL_TABLET | Freq: Every day | ORAL | Status: DC
  Administered 2020-02-18 – 2020-02-19 (×2): 20 mg via ORAL
  Filled 2020-02-18 (×2): qty 1

## 2020-02-18 MED ORDER — MAGIC MOUTHWASH
15.0000 mL | Freq: Four times a day (QID) | ORAL | Status: DC | PRN
  Filled 2020-02-18: qty 15

## 2020-02-18 MED ORDER — FENTANYL CITRATE (PF) 100 MCG/2ML IJ SOLN
25.0000 ug | INTRAMUSCULAR | Status: DC | PRN
Start: 2020-02-18 — End: 2020-02-18
  Administered 2020-02-18 (×2): 25 ug via INTRAVENOUS

## 2020-02-18 MED ORDER — PHENYLEPHRINE HCL (PRESSORS) 10 MG/ML IV SOLN
INTRAVENOUS | Status: AC
  Filled 2020-02-18: qty 1

## 2020-02-18 MED ORDER — OXYCODONE HCL 5 MG PO TABS
5.0000 mg | ORAL_TABLET | ORAL | Status: DC | PRN
  Administered 2020-02-18 – 2020-02-19 (×2): 10 mg via ORAL
  Filled 2020-02-18 (×2): qty 2

## 2020-02-18 MED ORDER — 0.9 % SODIUM CHLORIDE (POUR BTL) OPTIME
TOPICAL | Status: DC | PRN
  Administered 2020-02-18: 1000 mL

## 2020-02-18 MED ORDER — ENOXAPARIN SODIUM 40 MG/0.4ML ~~LOC~~ SOLN
40.0000 mg | SUBCUTANEOUS | Status: DC
  Filled 2020-02-18: qty 0.4

## 2020-02-18 MED ORDER — ACETAMINOPHEN 500 MG PO TABS
1000.0000 mg | ORAL_TABLET | ORAL | Status: AC
  Administered 2020-02-18: 1000 mg via ORAL
  Filled 2020-02-18: qty 2

## 2020-02-18 MED ORDER — PROMETHAZINE HCL 25 MG/ML IJ SOLN: 6.2500 mg | INTRAMUSCULAR | Status: DC | PRN

## 2020-02-18 MED ORDER — METHOCARBAMOL 500 MG PO TABS: 500.0000 mg | ORAL_TABLET | Freq: Four times a day (QID) | ORAL | Status: DC | PRN

## 2020-02-18 MED ORDER — ORAL CARE MOUTH RINSE: 15.0000 mL | Freq: Once | OROMUCOSAL | Status: AC

## 2020-02-18 MED ORDER — METHOCARBAMOL 1000 MG/10ML IJ SOLN
1000.0000 mg | Freq: Four times a day (QID) | INTRAVENOUS | Status: DC | PRN
  Filled 2020-02-18: qty 10

## 2020-02-18 MED ORDER — DIPHENHYDRAMINE HCL 50 MG/ML IJ SOLN: 12.5000 mg | Freq: Four times a day (QID) | INTRAMUSCULAR | Status: DC | PRN

## 2020-02-18 MED ORDER — METHOCARBAMOL 750 MG PO TABS: 750.0000 mg | ORAL_TABLET | Freq: Four times a day (QID) | ORAL | 2 refills | Status: DC | PRN

## 2020-02-18 MED ORDER — FENTANYL CITRATE (PF) 100 MCG/2ML IJ SOLN
INTRAMUSCULAR | Status: DC | PRN
  Administered 2020-02-18: 100 ug via INTRAVENOUS
  Administered 2020-02-18 (×2): 50 ug via INTRAVENOUS

## 2020-02-18 MED ORDER — MIDAZOLAM HCL 2 MG/2ML IJ SOLN
INTRAMUSCULAR | Status: AC
  Filled 2020-02-18: qty 2

## 2020-02-18 MED ORDER — PSYLLIUM 95 % PO PACK
1.0000 | PACK | Freq: Two times a day (BID) | ORAL | Status: DC
  Administered 2020-02-18: 20:00:00 1 via ORAL
  Filled 2020-02-18: qty 1

## 2020-02-18 MED ORDER — OXYCODONE HCL 5 MG PO TABS: 5.0000 mg | ORAL_TABLET | Freq: Four times a day (QID) | ORAL | 0 refills | Status: DC | PRN

## 2020-02-18 MED ORDER — METOPROLOL TARTRATE 5 MG/5ML IV SOLN: 5.0000 mg | Freq: Four times a day (QID) | INTRAVENOUS | Status: DC | PRN

## 2020-02-18 MED ORDER — METHOCARBAMOL 500 MG PO TABS
1000.0000 mg | ORAL_TABLET | Freq: Four times a day (QID) | ORAL | Status: DC | PRN
  Administered 2020-02-18: 20:00:00 1000 mg via ORAL
  Filled 2020-02-18: qty 2

## 2020-02-18 MED ORDER — FENTANYL CITRATE (PF) 250 MCG/5ML IJ SOLN
INTRAMUSCULAR | Status: AC
  Filled 2020-02-18: qty 5

## 2020-02-18 MED ORDER — SUGAMMADEX SODIUM 200 MG/2ML IV SOLN
INTRAVENOUS | Status: DC | PRN
  Administered 2020-02-18: 439.2 mg via INTRAVENOUS

## 2020-02-18 MED ORDER — ENSURE PRE-SURGERY PO LIQD
592.0000 mL | Freq: Once | ORAL | Status: DC
  Filled 2020-02-18: qty 592

## 2020-02-18 MED ORDER — LISINOPRIL-HYDROCHLOROTHIAZIDE 20-25 MG PO TABS: 1.0000 | ORAL_TABLET | Freq: Every day | ORAL | Status: DC

## 2020-02-18 MED ORDER — LACTATED RINGERS IV BOLUS: 1000.0000 mL | Freq: Three times a day (TID) | INTRAVENOUS | Status: DC | PRN

## 2020-02-18 MED ORDER — HYDROCHLOROTHIAZIDE 25 MG PO TABS
25.0000 mg | ORAL_TABLET | Freq: Every day | ORAL | Status: DC
  Administered 2020-02-18 – 2020-02-19 (×2): 25 mg via ORAL
  Filled 2020-02-18 (×2): qty 1

## 2020-02-18 MED ORDER — SODIUM CHLORIDE 0.9% FLUSH: 3.0000 mL | INTRAVENOUS | Status: DC | PRN

## 2020-02-18 MED ORDER — BUPIVACAINE-EPINEPHRINE (PF) 0.25% -1:200000 IJ SOLN
INTRAMUSCULAR | Status: AC
  Filled 2020-02-18: qty 60

## 2020-02-18 MED ORDER — BUPIVACAINE-EPINEPHRINE 0.25% -1:200000 IJ SOLN
INTRAMUSCULAR | Status: DC | PRN
  Administered 2020-02-18: 60 mL

## 2020-02-18 SURGICAL SUPPLY — 45 items
APL PRP STRL LF DISP 70% ISPRP (MISCELLANEOUS) ×1
APPLIER CLIP 5 13 M/L LIGAMAX5 (MISCELLANEOUS)
APR CLP MED LRG 5 ANG JAW (MISCELLANEOUS)
BINDER ABDOMINAL 12 ML 46-62 (SOFTGOODS) ×2 IMPLANT
CABLE HIGH FREQUENCY MONO STRZ (ELECTRODE) ×2 IMPLANT
CHLORAPREP W/TINT 26 (MISCELLANEOUS) ×2 IMPLANT
CLIP APPLIE 5 13 M/L LIGAMAX5 (MISCELLANEOUS) IMPLANT
CLSR STERI-STRIP ANTIMIC 1/2X4 (GAUZE/BANDAGES/DRESSINGS) ×2 IMPLANT
COVER SURGICAL LIGHT HANDLE (MISCELLANEOUS) ×2 IMPLANT
COVER WAND RF STERILE (DRAPES) ×2 IMPLANT
DECANTER SPIKE VIAL GLASS SM (MISCELLANEOUS) ×2 IMPLANT
DEVICE SECURE STRAP 25 ABSORB (INSTRUMENTS) ×4 IMPLANT
DEVICE TROCAR PUNCTURE CLOSURE (ENDOMECHANICALS) ×2 IMPLANT
DRAPE INCISE IOBAN 66X45 STRL (DRAPES) ×2 IMPLANT
DRAPE WARM FLUID 44X44 (DRAPES) ×2 IMPLANT
DRSG TEGADERM 2-3/8X2-3/4 SM (GAUZE/BANDAGES/DRESSINGS) ×2 IMPLANT
DRSG TEGADERM 4X4.75 (GAUZE/BANDAGES/DRESSINGS) ×2 IMPLANT
ELECT REM PT RETURN 15FT ADLT (MISCELLANEOUS) ×2 IMPLANT
GAUZE SPONGE 2X2 8PLY STRL LF (GAUZE/BANDAGES/DRESSINGS) ×1 IMPLANT
GLOVE ECLIPSE 8.0 STRL XLNG CF (GLOVE) ×2 IMPLANT
GLOVE INDICATOR 8.0 STRL GRN (GLOVE) ×2 IMPLANT
GOWN STRL REUS W/TWL XL LVL3 (GOWN DISPOSABLE) ×4 IMPLANT
IRRIG SUCT STRYKERFLOW 2 WTIP (MISCELLANEOUS) ×2
IRRIGATION SUCT STRKRFLW 2 WTP (MISCELLANEOUS) ×1 IMPLANT
KIT BASIN OR (CUSTOM PROCEDURE TRAY) ×2 IMPLANT
KIT TURNOVER KIT A (KITS) IMPLANT
MARKER SKIN DUAL TIP RULER LAB (MISCELLANEOUS) ×2 IMPLANT
MESH VENTRALIGHT ST 10X13IN (Mesh General) ×2 IMPLANT
NEEDLE INSUFFLATION 14GA 120MM (NEEDLE) ×2 IMPLANT
NEEDLE SPNL 22GX3.5 QUINCKE BK (NEEDLE) IMPLANT
PAD POSITIONING PINK XL (MISCELLANEOUS) ×2 IMPLANT
PENCIL SMOKE EVACUATOR (MISCELLANEOUS) IMPLANT
SCISSORS LAP 5X35 DISP (ENDOMECHANICALS) ×2 IMPLANT
SET TUBE SMOKE EVAC HIGH FLOW (TUBING) ×2 IMPLANT
SLEEVE ADV FIXATION 5X100MM (TROCAR) ×4 IMPLANT
SPONGE GAUZE 2X2 STER 10/PKG (GAUZE/BANDAGES/DRESSINGS) ×1
STRIP CLOSURE SKIN 1/2X4 (GAUZE/BANDAGES/DRESSINGS) ×4 IMPLANT
SUT MNCRL AB 4-0 PS2 18 (SUTURE) ×2 IMPLANT
SUT PDS AB 1 CT1 27 (SUTURE) ×4 IMPLANT
SUT PROLENE 1 CT 1 30 (SUTURE) ×12 IMPLANT
TOWEL OR 17X26 10 PK STRL BLUE (TOWEL DISPOSABLE) ×2 IMPLANT
TRAY LAPAROSCOPIC (CUSTOM PROCEDURE TRAY) ×2 IMPLANT
TROCAR ADV FIXATION 11X100MM (TROCAR) IMPLANT
TROCAR ADV FIXATION 5X100MM (TROCAR) ×2 IMPLANT
TROCAR BLADELESS OPT 5 100 (ENDOMECHANICALS) ×2 IMPLANT

## 2020-02-18 NOTE — Transfer of Care (Signed)
Immediate Anesthesia Transfer of Care Note  Patient: Jody Fitzgerald  Procedure(s) Performed: LAPAROSCOPIC VENTRAL WALL HERNIA REPAIR WITH  LYSIS OF ADHESIONS (Right Abdomen)  Patient Location: PACU  Anesthesia Type:General  Level of Consciousness: drowsy and patient cooperative  Airway & Oxygen Therapy: Patient Spontanous Breathing and Patient connected to face mask oxygen  Post-op Assessment: Report given to RN and Post -op Vital signs reviewed and stable  Post vital signs: Reviewed and stable  Last Vitals:  Vitals Value Taken Time  BP 159/99 02/18/20 1703  Temp    Pulse 73 02/18/20 1706  Resp 15 02/18/20 1706  SpO2 92 % 02/18/20 1706  Vitals shown include unvalidated device data.  Last Pain:  Vitals:   02/18/20 1150  PainSc: 0-No pain      Patients Stated Pain Goal: 2 (63/33/54 5625)  Complications: No complications documented.

## 2020-02-18 NOTE — Anesthesia Preprocedure Evaluation (Addendum)
Anesthesia Evaluation  Patient identified by MRN, date of birth, ID band Patient awake    Reviewed: Allergy & Precautions, NPO status , Patient's Chart, lab work & pertinent test results  History of Anesthesia Complications Negative for: history of anesthetic complications  Airway Mallampati: III  TM Distance: >3 FB Neck ROM: Full    Dental no notable dental hx. (+) Missing   Pulmonary asthma , former smoker,    Pulmonary exam normal breath sounds clear to auscultation       Cardiovascular hypertension, Pt. on medications Normal cardiovascular exam Rhythm:Regular Rate:Normal  ECG: NSR, rate 61   Neuro/Psych negative neurological ROS  negative psych ROS   GI/Hepatic negative GI ROS, Neg liver ROS,   Endo/Other  negative endocrine ROS  Renal/GU Renal InsufficiencyRenal disease (s/p right nephrectomy)     Musculoskeletal negative musculoskeletal ROS (+)   Abdominal (+) + obese,   Peds  Hematology HLD   Anesthesia Other Findings VENTRAL INCISIONAL FLANK ABDOMINAL WALL  Reproductive/Obstetrics                          Anesthesia Physical Anesthesia Plan  ASA: II  Anesthesia Plan: General   Post-op Pain Management:    Induction: Intravenous  PONV Risk Score and Plan: 4 or greater and Treatment may vary due to age or medical condition, Ondansetron, Dexamethasone and Midazolam  Airway Management Planned: Oral ETT  Additional Equipment:   Intra-op Plan:   Post-operative Plan: Extubation in OR  Informed Consent: I have reviewed the patients History and Physical, chart, labs and discussed the procedure including the risks, benefits and alternatives for the proposed anesthesia with the patient or authorized representative who has indicated his/her understanding and acceptance.     Dental advisory given and Interpreter used for interveiw  Plan Discussed with: CRNA  Anesthesia Plan  Comments:       Anesthesia Quick Evaluation

## 2020-02-18 NOTE — Progress Notes (Signed)
Preop assesment done with Scarlette Calico (interpreter).

## 2020-02-18 NOTE — Op Note (Addendum)
02/18/2020  PATIENT:  Jody Fitzgerald  65 y.o. female  Patient Care Team: Patient, No Pcp Per as PCP - General (General Practice) Astrid Divine, MD as Referring Physician (Cardiology) Michael Boston, MD as Consulting Physician (General Surgery) Koleen Distance, MD as Referring Physician (Gastroenterology)  PRE-OPERATIVE DIAGNOSIS:  Babette Relic ABDOMINAL WALL HERNIA  POST-OPERATIVE DIAGNOSIS:  INCISIONAL FLANK ABDOMINAL WALL HERNIA  PROCEDURE:   LAPAROSCOPIC REPAIR OF  INCISIONAL RIGHT FLANK ABDOMINAL WALL HERNIA WITH MESH  TAP BLOCK - BILATERAL  SURGEON:  Adin Hector, MD  ASSISTANT: Nurse   ANESTHESIA:     General  Nerve block provided with liposomal bupivacaine (Experel) mixed with 0.25% bupivacaine as a Bilateral TAP block x 9mL each side at the level of the transverse abdominis & preperitoneal spaces along the flank at the anterior axillary line, from subcostal ridge to iliac crest under laparoscopic guidance   EBL:  Total I/O In: 2000 [I.V.:2000] Out: 425 [Urine:375; Blood:50]  Per anesthesia record  Delay start of Pharmacological VTE agent (>24hrs) due to surgical blood loss or risk of bleeding:  no  DRAINS: none   SPECIMEN:  No Specimen  DISPOSITION OF SPECIMEN:  N/A  COUNTS:  YES  PLAN OF CARE: Admit for overnight observation  PATIENT DISPOSITION:  PACU - hemodynamically stable.  INDICATION: Hispanic woman with history of prior right nephrectomy with incisional hernia and pain.  Discussions made with the patient with the help of interpreter numerous time.  Because there is evidence of colon up within the hernia with increasing size and discomfort, surgical repair offered.  Patient has developed a ventral wall abdominal hernia. Recommendation was made for surgical repair  The anatomy & physiology of the abdominal wall was discussed. The pathophysiology of hernias was discussed. Natural history risks without surgery including progeressive enlargement,  pain, incarceration & strangulation was discussed. Contributors to complications such as smoking, obesity, diabetes, prior surgery, etc were discussed.  I feel the risks of no intervention will lead to serious problems that outweigh the operative risks; therefore, I recommended surgery to reduce and repair the hernia. I explained laparoscopic techniques with possible need for an open approach. I noted the probable use of mesh to patch and/or buttress the hernia repair.  Risks such as bleeding, infection, abscess, need for further treatment, heart attack, death, and other risks were discussed. I noted a good likelihood this will help address the problem. Goals of post-operative recovery were discussed as well. Possibility that this will not correct all symptoms was explained. I stressed the importance of low-impact activity, aggressive pain control, avoiding constipation, & not pushing through pain to minimize risk of post-operative chronic pain or injury. Possibility of reherniation especially with smoking, obesity, diabetes, immunosuppression, and other health conditions was discussed. We will work to minimize complications.   An educational handout further explaining the pathology & treatment options was given as well. Questions were answered. The patient expresses understanding & wishes to proceed with surgery.   OR FINDINGS: Moderate to large right lateral flank hernia just below the costal ridge 12 x 7 cm region  Type of repair: Laparoscopic underlay repair anteriorly & overlying retroperitoneum towards IVC posteriorly   Placement of mesh: Intraperitoneal underlay repair  Name of mesh: Bard Ventralight dual sided (polypropylene / Seprafilm)  Size of mesh: 33x27cm  Orientation: Transverse  Mesh overlap:  5-7cm   DESCRIPTION:   Informed consent was confirmed. The patient underwent general anaesthesia without difficulty. The patient was positioned appropriately - right side up decubitus.  VTE prevention in place. The patient's abdomen was clipped, prepped, & draped in a sterile fashion. Surgical timeout confirmed our plan.  The patient was positioned in reverse Trendelenburg. Abdominal entry was gained using a varies needle at first.  There is some tinge of blood.  Therefore I did optical entry in the right upper abdomen. Entry was clean.  I did see needle puncture site on the liver at the Varess needle.  Placed extra ports and controlled that with spatula tip cautery on the liver and needle puncture site quite easily.  I induced carbon dioxide insufflation. Camera inspection revealed no injury. Extra ports were carefully placed under direct laparoscopic visualization.   I could see adhesions on the parietal peritoneum under the abdominal wall.   I did laparoscopic lysis of adhesions to expose the entire anterior abdominal wall.  I primarily used focused sharp dissection.  I could see right flank hernia underneath the posterior lateral costal ridge.  Part of ascending colon going up into it.  I mobilized the right colon in a lateral medial fashion starting with the cecum and then working more superiorly.  Eventually rolled that down.  Had some dense adhesions of fat and hepatic flexure and liver near the upper hernia edge.  Carefully skeletonized and freed those off.  Mobilize the right hepatic lobe off of the retroperitoneum and chest wall.  Rolled some preperitoneal fat presumed to be some leftovers Gerota's fat and came more medially to the level of the IVC.  Duodenum was medial this so I do not need to do more aggressive kocherization.  I made sure hemostasis was good.  I mapped out the region using a needle passer.   To ensure that I would have at least 5 cm radial coverage outside of the hernia defect, I chose a 33x27cm dual sided mesh.  I placed #1 Prolene stitches around its medial edge x 5l.  Made a small incision on the lateral flank.  I rolled the mesh & placed into the peritoneal  cavity through the hernia defect.  I unrolled the mesh and positioned it appropriately.  I secured the mesh to cover up the hernia defect using a laparoscopic suture passer to pass the tails of the Prolene through the abdominal wall & tagged them with clamps for good transfascial suturing.  I started out in medial inferior and superior edges.  I used a laparoscopic tacker to help tacked the mesh as it overlapped superior to the right anterior lateral subcostal ridge as well as went below the right iliac crest.  Confirmed that the mesh was well centered.  I then proceeded to pass #1 Prolene sutures in and out the mesh along the right iliac crest and along the lateral posterior subcostal ridge down to the right paraspinal region.  Because of her morbid obesity with larger defect than expected I also placed some Prolene sutures in and out the center part of the mesh as well.  I pulled up the free stitched Prolenes that were along the medial inferior and superior edges of the mesh to lay it out flat.  Between the prestitched and added sutures, a total of 14 transfascial stitches of #1 Prolene have been placed securing the mesh well.  Then used another tacker to help tacked the edges of the mesh well.  Rolled the right colon back over and tacked along the white line of Toldt so that the posterior half of the mesh was tucked in the retroperitoneum.  I did reinspection. Hemostasis  was good. Mesh laid well. I completed a broad field block of local anesthesia at fascial stitch sites & fascial closure areas.    Capnoperitoneum was evacuated. Ports were removed. The skin was closed with Monocryl at the port sites and Steri-Strips on the fascial stitch puncture sites.  Patient is being extubated to go to the recovery room.  I discussed operative findings, updated the patient's status, discussed probable steps to recovery, and gave postoperative recommendations to the patient's son & daughter.  Recommendations were made.   Questions were answered.  They expressed understanding & appreciation.  Adin Hector, M.D., F.A.C.S. Gastrointestinal and Minimally Invasive Surgery [facility] [address] [place], [place] [address] [phone] Main / Paging  02/18/2020 4:45 PM

## 2020-02-18 NOTE — Anesthesia Procedure Notes (Signed)
Procedure Name: Intubation Date/Time: 02/18/2020 1:45 PM Performed by: Montel Clock, CRNA Pre-anesthesia Checklist: Patient identified, Emergency Drugs available, Suction available, Patient being monitored and Timeout performed Patient Re-evaluated:Patient Re-evaluated prior to induction Oxygen Delivery Method: Circle system utilized Preoxygenation: Pre-oxygenation with 100% oxygen Induction Type: IV induction Ventilation: Mask ventilation without difficulty and Oral airway inserted - appropriate to patient size Laryngoscope Size: Mac and 3 Grade View: Grade II Tube type: Oral Tube size: 7.0 mm Number of attempts: 1 Airway Equipment and Method: Stylet Placement Confirmation: ETT inserted through vocal cords under direct vision,  positive ETCO2 and breath sounds checked- equal and bilateral Secured at: 21 cm Tube secured with: Tape Dental Injury: Teeth and Oropharynx as per pre-operative assessment and Injury to lip  Comments: Poor grade 2b view with downward laryngeal pressure. Limited head mobility and excessive tissue. ETT passed through cords. Merrilee Seashore to left upper lip and ointment applied.

## 2020-02-18 NOTE — Anesthesia Postprocedure Evaluation (Signed)
Anesthesia Post Note  Patient: Jody Fitzgerald  Procedure(s) Performed: LAPAROSCOPIC VENTRAL WALL HERNIA REPAIR WITH  LYSIS OF ADHESIONS (Right Abdomen)     Patient location during evaluation: PACU Anesthesia Type: General Level of consciousness: awake and alert and oriented Pain management: pain level controlled Vital Signs Assessment: post-procedure vital signs reviewed and stable Respiratory status: spontaneous breathing, nonlabored ventilation and respiratory function stable Cardiovascular status: blood pressure returned to baseline Postop Assessment: no apparent nausea or vomiting Anesthetic complications: no   No complications documented.  Last Vitals:  Vitals:   02/18/20 1837 02/18/20 1926  BP: (!) 172/106 (!) 166/99  Pulse: 72 80  Resp: 18 17  Temp: 36.5 C 36.8 C  SpO2: 96% 99%    Last Pain:  Vitals:   02/18/20 1926  TempSrc: Oral  PainSc:                  Brennan Bailey

## 2020-02-18 NOTE — H&P (Signed)
Jody Fitzgerald DOB: 28-Mar-1955 Divorced / Language: Undefined / Race: Refused to Report/Unreported  Patient Care Team: Patient, No Pcp Per as PCP - General ([place]) Astrid Divine, MD as Referring Physician (Cardiology) Michael Boston, MD as Consulting Physician (General Surgery) Koleen Distance, MD as Referring Physician (Gastroenterology)   ` Patient sent for surgical consultation at the request of [name]  Chief Complaint: Abdominal pain had old kidney incision. Concern for hernia. ` ` The patient is a morbidly obese woman. She comes in today by herself. She speaks no [place].. Used a telephone interpreter help sort out. No records were available at first. After calling her office numerous times, able to get some of her records. She mainly been [place]. She had some type of GYN surgery. 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 6 months later she had a right nephrectomy and possible partial hepatectomy. That was done through a right subcostal incision. Patient noted worsening pulling and discomfort while she was visiting her daughter at the [place]. She has been in [place] for 3 years. She had a CAT scan which showed concern of splenic flexure and descending colon going any hernia in the right upper quadrant. No obstruction. She discussed with her primary care physician. Surgical consultation offered. She's had some other abdominal complaints. Had an ultrasound that was underwhelming. Had CT of the chest that showed no metastatic disease. She gets short of breath with exertion. Apparently has some left ventricular hypertrophy. Due to see Dr. Brigitte Pulse with cardiology. Has hypertension. Initially poorly controlled with seems better now. At some point had Covid as by reactive antibody testing last month. Has borderline elevated hemoglobin A1c. She is due to get screening for colon cancer by gastroenterology. History of  asthma and wheezing. She used to smoke but does not smoke anymore. No history of skin infections or wound infections with her is that she can recall. She moves her bowels about every other day. She is not on blood thinners. Can walk about 10-15 minutes before she has to stop. She has not had episodes of nausea or vomiting.  (Review of systems as stated in this history (HPI) or in the review of systems. Otherwise all other 12 point ROS are negative) ` ` `  This patient encounter took 120 minutes today to perform the following: obtain history, perform exam, review outside records, interpret tests & imaging, counsel the patient on their diagnosis; and, document this encounter, including findings & plan in the electronic health record (EHR).   Past Surgical History [name], CMA; 07/08/2019 11:17 AM) Hysterectomy (due to cancer) - Complete  Nephrectomy  Right.  Diagnostic Studies History [name], CMA; 07/08/2019 11:17 AM) Colonoscopy  >10 years ago Mammogram  >3 years ago Pap Smear  >5 years ago  Allergies [name], CMA; 07/08/2019 11:18 AM) No Known Drug Allergies  [07/08/2019]:  Social History [name], CMA; 07/08/2019 11:17 AM) Alcohol use  Remotely quit alcohol use. Caffeine use  Coffee. No drug use  Tobacco use  Former smoker.  Family History [name], CMA; 07/08/2019 11:17 AM) Diabetes Mellitus  Brother, Family Members In General, Father, Sister. Hypertension  Brother, Mother, Sister. Kidney Disease  Sister.  Pregnancy / Birth History [name], CMA; 07/08/2019 11:17 AM) Age at menarche  76 years. Age of menopause  46-50 Gravida  53 Length (months) of breastfeeding  12-24 Maternal age  <15 Para  6 Regular periods   Other Problems [name], CMA; 07/08/2019  11:17 AM) Anxiety Disorder  Asthma  Bladder Problems  Cancer  High blood pressure     Review of Systems ([name] CMA;  07/08/2019 11:17 AM) General Present- Weight Gain. Not Present- Appetite Loss, Chills, Fatigue, Fever, Night Sweats and Weight Loss. Skin Not Present- Change in Wart/Mole, Dryness, Hives, Jaundice, New Lesions, Non-Healing Wounds, Rash and Ulcer. HEENT Present- Wears glasses/contact lenses. Not Present- Earache, Hearing Loss, Hoarseness, Nose Bleed, Oral Ulcers, Ringing in the Ears, Seasonal Allergies, Sinus Pain, Sore Throat, Visual Disturbances and Yellow Eyes. Respiratory Present- Snoring. Not Present- Bloody sputum, Chronic Cough, Difficulty Breathing and Wheezing. Cardiovascular Not Present- Chest Pain, Difficulty Breathing Lying Down, Leg Cramps, Palpitations, Rapid Heart Rate, Shortness of Breath and Swelling of Extremities. Gastrointestinal Not Present- Abdominal Pain, Bloating, Bloody Stool, Change in Bowel Habits, Chronic diarrhea, Constipation, Difficulty Swallowing, Excessive gas, Gets full quickly at meals, Hemorrhoids, Indigestion, Nausea, Rectal Pain and Vomiting. Female Genitourinary Present- Nocturia and Urgency. Not Present- Frequency, Painful Urination and Pelvic Pain. Musculoskeletal Not Present- Back Pain, Joint Pain, Joint Stiffness, Muscle Pain, Muscle Weakness and Swelling of Extremities. Neurological Not Present- Decreased Memory, Fainting, Headaches, Numbness, Seizures, Tingling, Tremor, Trouble walking and Weakness. Psychiatric Not Present- Anxiety, Bipolar, Change in Sleep Pattern, Depression, Fearful and Frequent crying. Endocrine Present- Cold Intolerance. Not Present- Excessive Hunger, Hair Changes, Heat Intolerance, Hot flashes and New Diabetes. Hematology Present- Blood Thinners. Not Present- Easy Bruising, Excessive bleeding, Gland problems, HIV and Persistent Infections.  Vitals ([name] CMA; 07/08/2019 11:18 AM) 07/08/2019 11:18 AM Weight: 245 lb Height: 60in Body Surface Area: 2.03 m Body Mass Index: 47.85 kg/m  Pulse: 92 (Regular)  BP:  110/70(Sitting, Left Arm, Standard)       Physical Exam Adin Hector MD; 07/08/2019 1:12 PM) General Mental Status-Alert. General Appearance-Not in acute distress, Not Sickly. Orientation-Oriented X3. Hydration-Well hydrated. Voice-Normal.  Integumentary Global Assessment Upon inspection and palpation of skin surfaces of the - Axillae: non-tender, no inflammation or ulceration, no drainage. and Distribution of scalp and body hair is normal. General Characteristics Temperature - normal warmth is noted.  Head and Neck Head-normocephalic, atraumatic with no lesions or palpable masses. Face Global Assessment - atraumatic, no absence of expression. Neck Global Assessment - no abnormal movements, no bruit auscultated on the right, no bruit auscultated on the left, no decreased range of motion, non-tender. Trachea-midline. Thyroid Gland Characteristics - non-tender.  Eye Eyeball - Left-Extraocular movements intact, No Nystagmus - Left. Eyeball - Right-Extraocular movements intact, No Nystagmus - Right. Cornea - Left-No Hazy - Left. Cornea - Right-No Hazy - Right. Sclera/Conjunctiva - Left-No scleral icterus, No Discharge - Left. Sclera/Conjunctiva - Right-No scleral icterus, No Discharge - Right. Pupil - Left-Direct reaction to light normal. Pupil - Right-Direct reaction to light normal.  ENMT Ears Pinna - Left - no drainage observed, no generalized tenderness observed. Pinna - Right - no drainage observed, no generalized tenderness observed. Nose and Sinuses External Inspection of the Nose - no destructive lesion observed. Inspection of the nares - Left - quiet respiration. Inspection of the nares - Right - quiet respiration. Mouth and Throat Lips - Upper Lip - no fissures observed, no pallor noted. Lower Lip - no fissures observed, no pallor noted. Nasopharynx - no discharge present. Oral Cavity/Oropharynx - Tongue - no dryness  observed. Oral Mucosa - no cyanosis observed. Hypopharynx - no evidence of airway distress observed.  Chest and Lung Exam Inspection Movements - Normal and Symmetrical. Accessory muscles - No use of accessory muscles in breathing.  Palpation Palpation of the chest reveals - Non-tender. Auscultation Breath sounds - Normal and Clear.  Cardiovascular Auscultation Rhythm - Regular. Murmurs & Other Heart Sounds - Auscultation of the heart reveals - No Murmurs and No Systolic Clicks.  Abdomen Inspection Inspection of the abdomen reveals - No Visible peristalsis and No Abnormal pulsations. Umbilicus - No Bleeding, No Urine drainage. Palpation/Percussion Palpation and Percussion of the abdomen reveal - Soft, Non Tender, No Rebound tenderness, No Rigidity (guarding) and No Cutaneous hyperesthesia. Note: Apple body habitus. Obese. Right upper flank subcostal incision with some bulging and discomfort had anterior axillary line about 2 cm inferior to the costal ridge. Seems concerning for a incisional hernia. Seems reducible. Hard to tell with her thick abdominal wall  Abdomen soft. Not severely distended. No diastasis recti. No other anterior abdominal wall hernias   Female Genitourinary Sexual Maturity Tanner 5 - Adult hair pattern. Note: No vaginal bleeding nor discharge   Peripheral Vascular Upper Extremity Inspection - Left - No Cyanotic nailbeds - Left, Not Ischemic. Inspection - Right - No Cyanotic nailbeds - Right, Not Ischemic.  Neurologic Neurologic evaluation reveals -normal attention span and ability to concentrate, able to name objects and repeat phrases. Appropriate fund of knowledge , normal sensation and normal coordination. Mental Status Affect - not angry, not paranoid. Cranial Nerves-Normal Bilaterally. Gait-Normal.  Neuropsychiatric Mental status exam performed with findings of-able to articulate well with normal speech/language, rate, volume and  coherence, thought content normal with ability to perform basic computations and apply abstract reasoning and no evidence of hallucinations, delusions, obsessions or homicidal/suicidal ideation.  Musculoskeletal Global Assessment Spine, Ribs and Pelvis - no instability, subluxation or laxity. Right Upper Extremity - no instability, subluxation or laxity.  Lymphatic Head & Neck  General Head & Neck Lymphatics: Bilateral - Description - No Localized lymphadenopathy. Axillary  General Axillary Region: Bilateral - Description - No Localized lymphadenopathy. Femoral & Inguinal  Generalized Femoral & Inguinal Lymphatics: Left - Description - No Localized lymphadenopathy. Right - Description - No Localized lymphadenopathy.    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  As best I can gather, she most likely has an incisional hernia involving right nephrectomy incision. It does seem reducible but hard to tell.  There is a CAT scan reports that talks about incisional hernia.  Report talks about the splenic flexure and descending colon is in this right flank hernia. That does not make sense.  Vital long discussion with the patient through an interpreter. We call her primary care office many times. Took 2-1/2 hours to work through all this and still insufficient.  After explaining many 3 due to interpreters, tried to convince the patient to help [place] by getting records. She insists that primary office has all her records from when she was up and [place]. He will be variable to have that. He did not why she had a nephrectomy and if she is cancer free.  I also need to have the actual images. It seems like she did have a CT of the actual CAT scan from [place]. I tried to go through care everywhere, but there are no records  available. I need to strictures. Otherwise, we need to repeat the CT scan of the abdomen. She's had an ultrasound and CT chest on her underwhelming. No mention of hernia as opposed to the 1 in [place] that noted it.  Should she have an incisional hernia that involves colon, she may benefit from surgery to repair this. Laparoscopic approach with the decubitus positioning. However her morbid obesity makes risk of recurrence rather high. A hold off on any aggressive intervention given the morbidity. Would want medical probably cardiac clearance first.  After very long discussion I gave her a handout that talks about incisional hernias in Spanish. We strongly recommend that she have her daughter with her to help interpret and support her through this. Again strongly recommend that the patient can get all her records for [place] to help sort out what is going on. Current Plans Follow Up - Call [facility] office after tests / studies doneto discuss further plans Pt Education - Pamphlet Given - Hernia Surgery: discussed with patient and provided information. USES SPANISH AS PRIMARY SPOKEN LANGUAGE (Z78.9) Impression: Spent over 75 minutes using an interpreter talking with the patient. 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 She describes kidney stones getting so large that part of the liver had to be removed. The sounds more like a renal cell that to me. Another possibility is need for right nephrectomy after hysterectomy for cancer due to ureteral stricture/injury. Copy of operative warts essential to sort out what happened. 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  While I do not have the actual films, there is a report from earlier this year have a CAT scan showing incisional hernia along right nephrectomy incision.  No obvious cancer recurrence.  Therefore I can offer incisional hernia repair.  Laparoscopic approach.  Most likely will need a large sheet of mesh given her obesity a moderately large-sized defect.  Most likely overnight stay at the very least.  I think he would be very helpful to have family with her especially since I believe her children or niece is bilingual.  I will help facilitate communication.  I Have the patient and family come to the office to discuss surgery again if they wish.  I noted the patient's been strongly motivated to get surgery done.  > From: [name] CMA > To: Adin Hector MD > Sent: 11/24/2019 10:01 AM > Dr Johney Maine has reviewed the outside rec's from [facility] and states that we can proceed with getting the pt scheduled for surgery now for the incisional hernia repair. Per Dr Johney Maine the pt doesn't need a repeat CT scan that we had ordered since now reviewing the rec's the pt had a recent CT this year. The son Mallie Mussel has been notified and is aware that we are going to turn sx orders into scheduling w/his name on the sx orders as a contact to schedule the pt's sx. The son Mallie Mussel is aware.  > From: [name] CMA > To: [name] CMA > Sent: 11/21/2019 9:04 AM > Received rec's from [facility] by mail will show to DR Makailah Slavick on Monday for review then will decide on plan.  > From: [name] CMA > To: [name] CMA > Sent: 11/13/2019 3:56 PM > Charlene w/[facility] [phone] ([phone]) returned my  call. I explained the situation to her and she is going to try to help get [place] the rec's. Dr Johney Maine was sitting beside me while I was on the phone explaining how hard this has been trying to  get rec's on this referral from their office that was sent over in June. Randell Patient is going to mail me o.v. notes, CT reports, and what info she see's from [place] that the pt brought in to their office. I requested for her to mail the information to my attn here at [facility].  > From: [name] CMA > To: [name] CMA > Sent: 11/13/2019 2:51 PM > Left another message w/DR Ronnald Ramp office asking for the supervisor to call me back or someone. We leave messages and no one calls [place] back. stating that i have been trying to get rec's from their office for months now on this pt that they referred to Dr Johney Maine several months a go we can't give a surgical recommendation till they give [place] her rec's from there. The pt is calling our office upset b/c she is wanting to know if she needs sx or not. We have tried multiple times calling [facility] and the pt has even tried herself. We need all rec's from Dr Ronnald Ramp, CT report, and outside rec's that has any operative reports that may help explain what other surgeries the pt has had done.  > From: [name] CMA > To: [name] CMA > Sent: 11/12/2019 9:53 AM > Called pt's son again to advise him that we have tried several times reaching out to [facility] about getting the pt's rec's that pertain to the referral that was sent over here on the incisional hernia. We have tried explaining to his mom also that Dr Johney Maine is needing more information in order to give her a surgical recommendation. I know the pt has been calling wanting to get scheduled for sx but I explained that Dr Johney Maine has not given the pt a surgical opinion yet b/c she is complicated. I advised that I know the pt has brought some rec's into the office but they are not from the pcp like we have advised. I know the pt has been cleared by cardiology but it's not cardiology rec's that we need it's the pcp Dr Ronnald Ramp. I advised that Dr Johney Maine did say that we could just order  another CT scan on the pt to see about this incisional hernia. I did place a CT A/P w/contrast and marked for them to call the son Mallie Mussel since the pt speaks spanish. Mallie Mussel understands.  > From: Oretha Ellis  > To: [name] CMA > Sent: 11/06/2019 12:17 PM > Pt's son, Mallie Mussel, returned call. Please call back at your convenience. Thank you, Rosendo Gros  > From: [name] CMA > To: [name] CMA > Sent: 11/06/2019 9:15 AM > LMOm for pt's son Mallie Mussel to call me back.  > From: Jackquline Denmark  > To: [name] CMA > Sent: 11/06/2019 8:20 AM > sent this to myself  > From: Jackquline Denmark  > To: Jackquline Denmark  > Sent: 11/05/2019 4:57 PM > Mallie Mussel Joeanne's phone#[phone]  > From: Jackquline Denmark  > To: [name] CMA > Sent: 11/05/2019 4:55 PM > Pt's son, Shelonda Bollenbacher, calling to schedule his mother's sx, please call.  > From: [name] CMA > To: [name] CMA > Sent: 10/27/2019 10:00 AM > Called pt's pcp Dr Ronnald Ramp office spoke to [facility]  about what we are needing on this pt in order to get the pt a surgical opinion. I advised Tiffany that we need any of their office notes, CT reports, and any outside rec's like operative reports that would help explain some of the pt's medical hx. Tiffany will try to work on this today and either email the rec's vs. fax them to [place].   I asked for Earnest Bailey to call the pt to give her a update on what we are working on in the time being. Pt is aware.  > From: [name] CMA > To: [name] CMA > Sent: 10/24/2019 3:36 PM > LMom w/[facility] to call me back. It's very important that I speak w/someone over there to explain what rec's Dr Johney Maine is needing for this pt.  > From: Jackquline Denmark  > To: [name] CMA > Sent: 10/24/2019 3:06 PM > Pt dropped off records from [place], [place] made copies, they are up at the front desk.  > From: [name] CMA > To: [name] CMA >  Sent: 10/23/2019 11:11 AM > Returned call to [place] but had to [facility] for her to call me back. I need to speak w/Carmen when she calls back.  > From: Jackquline Denmark  > To: [name] CMA > Sent: 10/23/2019 8:54 AM > Asencion Partridge, [facility], called regarding this pt, she states the pt called and told our office has not received the clearance for her sx, you can reach her at [phone].   > From: [name] CMA > To: [name] CMA > Sent: 10/22/2019 3:20 PM > Earnest Bailey w/our office called pt to let her know the recommendations below by Dr Johney Maine. Earnest Bailey explained to the pt that she needs to get the rec's from [facility] and her outside rec's from [place] if pt wishes to have a surgical evaluation. Pt advised that when she gets the rec's sent to [place] then we can move forward with some of the other request listed below. Pt is aware.  > From: Adin Hector MD > To: [name] CMA > Sent: 10/20/2019 12:39 PM > While the patient is insistent on getting surgery, that doesn't mean that I'm going to consider it until the workup is complete.  While we have a cardiac clearance note from [facility], I think it would be wise to have a copy of their echocardiogram report from June 2021.  We still have no records from the primary care physician or from [place] that would help explain why she had kidney and GYN surgery.  I need records from her prior surgeries up in [place]. The patient has insisted that [facility] has this. We have not gotten those records. Would like to actually see the operative reports to know what was done. Sounds like she had a hysterectomy and then a nephrectomy.  I have reviewed the CT scan. I see no report. Just the disc. It would be nice to know what the official read is. I cannot pull it up in the PACS system to work around that. The CT scan is of the chest only. I can see the upper part of a right  flank hernia that seems to correlate with her prior right nephrectomy, but it is not complete. I think the patient will benefit from a CT scan of the abdomen and pelvis with oral and IV contrast to help delineate the full hernia for possible planning. Would be helpful  to make sure that she has no metastatic disease or other issues since she's had 2 surgeries for GYN and renal tumors (?cancers).   It would be helpful to know from primary care office if she's had any follow-up with medical oncology and they are not concerns about recurrence or need for chemotherapy, etc.  If the CAT scan shows no surprises, she does not require any further oncology follow-up/chemotherapy, we get records of last PCP visit, copies of her prior surgeries up in [place]; then, we can offer her to return to the office with family to discuss surgery repair with a family member there since she speaks no [place]. Suspect she will need a large sheet of mesh given her obesity and the size of this hernia.  If this is not acceptable to the patient, she can get a opinion with someone else  > From: [name] CMA > To: Adin Hector MD > Sent: 10/17/2019 3:56 PM > Please advise.  We did get a cd mailed to the office today for you to review of CT chest.  > From: Cipriano Mile  > To: [name] CMA > Sent: 10/17/2019 12:34 PM > Pt is calling to schedule sx.    ADDENDUM #4:  CT scan c/w right flank  hernia No evidence of metastatic disease PATIENT READY FOR SURGERY  Adin Hector, MD, FACS, MASCRS Gastrointestinal and Minimally Invasive Surgery  [facility] [address], [place], [place] [address] [phone] Fax [phone] Main/Paging  CONTACT INFORMATION: Weekday (9AM-5PM) concerns: Call [facility] main office at [phone] Weeknight (5PM-9AM) or Weekend/Holiday concerns: Check [email or web address] for General Surgery [facility] coverage (Please, do not use  SecureChat as it is not reliable communication to operating surgeons for immediate patient care)

## 2020-02-18 NOTE — Plan of Care (Signed)
  Problem: Pain Managment: Goal: General experience of comfort will improve Outcome: Progressing   Problem: Safety: Goal: Ability to remain free from injury will improve Outcome: Progressing   Problem: Skin Integrity: Goal: Risk for impaired skin integrity will decrease Outcome: Progressing   

## 2020-02-19 ENCOUNTER — Other Ambulatory Visit: Payer: Self-pay

## 2020-02-19 ENCOUNTER — Encounter (HOSPITAL_COMMUNITY): Payer: Self-pay | Admitting: Surgery

## 2020-02-19 ENCOUNTER — Inpatient Hospital Stay (HOSPITAL_COMMUNITY)
Admission: EM | Admit: 2020-02-19 | Discharge: 2020-02-22 | DRG: 907 | Disposition: A | Discharge status: Home Health | Payer: Medicare HMO | Attending: Internal Medicine | Admitting: Internal Medicine

## 2020-02-19 ENCOUNTER — Emergency Department (HOSPITAL_COMMUNITY): Payer: Medicare HMO

## 2020-02-19 DIAGNOSIS — I129 Hypertensive chronic kidney disease with stage 1 through stage 4 chronic kidney disease, or unspecified chronic kidney disease: Secondary | ICD-10-CM | POA: Diagnosis present

## 2020-02-19 DIAGNOSIS — Z886 Allergy status to analgesic agent status: Secondary | ICD-10-CM

## 2020-02-19 DIAGNOSIS — M25559 Pain in unspecified hip: Secondary | ICD-10-CM

## 2020-02-19 DIAGNOSIS — T40601A Poisoning by unspecified narcotics, accidental (unintentional), initial encounter: Secondary | ICD-10-CM | POA: Diagnosis not present

## 2020-02-19 DIAGNOSIS — E662 Morbid (severe) obesity with alveolar hypoventilation: Secondary | ICD-10-CM | POA: Diagnosis present

## 2020-02-19 DIAGNOSIS — R739 Hyperglycemia, unspecified: Secondary | ICD-10-CM | POA: Diagnosis present

## 2020-02-19 DIAGNOSIS — Z20822 Contact with and (suspected) exposure to covid-19: Secondary | ICD-10-CM | POA: Diagnosis present

## 2020-02-19 DIAGNOSIS — Z905 Acquired absence of kidney: Secondary | ICD-10-CM | POA: Diagnosis not present

## 2020-02-19 DIAGNOSIS — Z833 Family history of diabetes mellitus: Secondary | ICD-10-CM

## 2020-02-19 DIAGNOSIS — K760 Fatty (change of) liver, not elsewhere classified: Secondary | ICD-10-CM | POA: Diagnosis present

## 2020-02-19 DIAGNOSIS — R7989 Other specified abnormal findings of blood chemistry: Secondary | ICD-10-CM | POA: Diagnosis present

## 2020-02-19 DIAGNOSIS — J45909 Unspecified asthma, uncomplicated: Secondary | ICD-10-CM | POA: Diagnosis present

## 2020-02-19 DIAGNOSIS — R7303 Prediabetes: Secondary | ICD-10-CM | POA: Diagnosis present

## 2020-02-19 DIAGNOSIS — J9601 Acute respiratory failure with hypoxia: Secondary | ICD-10-CM

## 2020-02-19 DIAGNOSIS — D62 Acute posthemorrhagic anemia: Secondary | ICD-10-CM | POA: Diagnosis present

## 2020-02-19 DIAGNOSIS — J9600 Acute respiratory failure, unspecified whether with hypoxia or hypercapnia: Secondary | ICD-10-CM | POA: Diagnosis present

## 2020-02-19 DIAGNOSIS — K5900 Constipation, unspecified: Secondary | ICD-10-CM | POA: Diagnosis present

## 2020-02-19 DIAGNOSIS — R946 Abnormal results of thyroid function studies: Secondary | ICD-10-CM | POA: Diagnosis present

## 2020-02-19 DIAGNOSIS — E669 Obesity, unspecified: Secondary | ICD-10-CM

## 2020-02-19 DIAGNOSIS — K458 Other specified abdominal hernia without obstruction or gangrene: Secondary | ICD-10-CM | POA: Diagnosis present

## 2020-02-19 DIAGNOSIS — I10 Essential (primary) hypertension: Secondary | ICD-10-CM | POA: Diagnosis present

## 2020-02-19 DIAGNOSIS — T402X1A Poisoning by other opioids, accidental (unintentional), initial encounter: Principal | ICD-10-CM | POA: Diagnosis present

## 2020-02-19 DIAGNOSIS — E785 Hyperlipidemia, unspecified: Secondary | ICD-10-CM | POA: Diagnosis present

## 2020-02-19 DIAGNOSIS — Z885 Allergy status to narcotic agent status: Secondary | ICD-10-CM

## 2020-02-19 DIAGNOSIS — N179 Acute kidney failure, unspecified: Secondary | ICD-10-CM | POA: Diagnosis present

## 2020-02-19 DIAGNOSIS — Z8541 Personal history of malignant neoplasm of cervix uteri: Secondary | ICD-10-CM

## 2020-02-19 DIAGNOSIS — M25551 Pain in right hip: Secondary | ICD-10-CM | POA: Diagnosis present

## 2020-02-19 DIAGNOSIS — G4733 Obstructive sleep apnea (adult) (pediatric): Secondary | ICD-10-CM | POA: Diagnosis present

## 2020-02-19 DIAGNOSIS — Z87891 Personal history of nicotine dependence: Secondary | ICD-10-CM

## 2020-02-19 DIAGNOSIS — Z79899 Other long term (current) drug therapy: Secondary | ICD-10-CM

## 2020-02-19 DIAGNOSIS — J9602 Acute respiratory failure with hypercapnia: Secondary | ICD-10-CM

## 2020-02-19 DIAGNOSIS — Z6841 Body Mass Index (BMI) 40.0 and over, adult: Secondary | ICD-10-CM

## 2020-02-19 DIAGNOSIS — R0602 Shortness of breath: Secondary | ICD-10-CM

## 2020-02-19 DIAGNOSIS — N183 Chronic kidney disease, stage 3 unspecified: Secondary | ICD-10-CM | POA: Diagnosis present

## 2020-02-19 LAB — CBC
HCT: 39.1 % (ref 36.0–46.0)
Hemoglobin: 12.5 g/dL (ref 12.0–15.0)
MCH: 30.9 pg (ref 26.0–34.0)
MCHC: 32 g/dL (ref 30.0–36.0)
MCV: 96.5 fL (ref 80.0–100.0)
Platelets: 255 10*3/uL (ref 150–400)
RBC: 4.05 MIL/uL (ref 3.87–5.11)
RDW: 12.7 % (ref 11.5–15.5)
WBC: 9.5 10*3/uL (ref 4.0–10.5)
nRBC: 0 % (ref 0.0–0.2)

## 2020-02-19 LAB — CBC WITH DIFFERENTIAL/PLATELET
Abs Immature Granulocytes: 0.04 10*3/uL (ref 0.00–0.07)
Basophils Absolute: 0 10*3/uL (ref 0.0–0.1)
Basophils Relative: 0 %
Eosinophils Absolute: 0 10*3/uL (ref 0.0–0.5)
Eosinophils Relative: 0 %
HCT: 41.8 % (ref 36.0–46.0)
Hemoglobin: 12.8 g/dL (ref 12.0–15.0)
Immature Granulocytes: 0 %
Lymphocytes Relative: 25 %
Lymphs Abs: 3.1 10*3/uL (ref 0.7–4.0)
MCH: 31 pg (ref 26.0–34.0)
MCHC: 30.6 g/dL (ref 30.0–36.0)
MCV: 101.2 fL — ABNORMAL HIGH (ref 80.0–100.0)
Monocytes Absolute: 1.4 10*3/uL — ABNORMAL HIGH (ref 0.1–1.0)
Monocytes Relative: 11 %
Neutro Abs: 8 10*3/uL — ABNORMAL HIGH (ref 1.7–7.7)
Neutrophils Relative %: 64 %
Platelets: 295 10*3/uL (ref 150–400)
RBC: 4.13 MIL/uL (ref 3.87–5.11)
RDW: 13.1 % (ref 11.5–15.5)
WBC: 12.6 10*3/uL — ABNORMAL HIGH (ref 4.0–10.5)
nRBC: 0 % (ref 0.0–0.2)

## 2020-02-19 LAB — I-STAT CHEM 8, ED
BUN: 21 mg/dL (ref 8–23)
Calcium, Ion: 1.24 mmol/L (ref 1.15–1.40)
Chloride: 96 mmol/L — ABNORMAL LOW (ref 98–111)
Creatinine, Ser: 1.4 mg/dL — ABNORMAL HIGH (ref 0.44–1.00)
Glucose, Bld: 190 mg/dL — ABNORMAL HIGH (ref 70–99)
HCT: 42 % (ref 36.0–46.0)
Hemoglobin: 14.3 g/dL (ref 12.0–15.0)
Potassium: 4 mmol/L (ref 3.5–5.1)
Sodium: 134 mmol/L — ABNORMAL LOW (ref 135–145)
TCO2: 30 mmol/L (ref 22–32)

## 2020-02-19 LAB — BASIC METABOLIC PANEL
Anion gap: 10 (ref 5–15)
Anion gap: 10 (ref 5–15)
BUN: 12 mg/dL (ref 8–23)
BUN: 21 mg/dL (ref 8–23)
CO2: 28 mmol/L (ref 22–32)
CO2: 29 mmol/L (ref 22–32)
Calcium: 8.8 mg/dL — ABNORMAL LOW (ref 8.9–10.3)
Calcium: 9 mg/dL (ref 8.9–10.3)
Chloride: 98 mmol/L (ref 98–111)
Chloride: 95 mmol/L — ABNORMAL LOW (ref 98–111)
Creatinine, Ser: 0.99 mg/dL (ref 0.44–1.00)
Creatinine, Ser: 1.35 mg/dL — ABNORMAL HIGH (ref 0.44–1.00)
GFR, Estimated: >60 mL/min (ref >60)
GFR, Estimated: 44 mL/min — ABNORMAL LOW (ref >60)
Glucose, Bld: 130 mg/dL — ABNORMAL HIGH (ref 70–99)
Glucose, Bld: 200 mg/dL — ABNORMAL HIGH (ref 70–99)
Potassium: 4.4 mmol/L (ref 3.5–5.1)
Potassium: 4 mmol/L (ref 3.5–5.1)
Sodium: 136 mmol/L (ref 135–145)
Sodium: 134 mmol/L — ABNORMAL LOW (ref 135–145)

## 2020-02-19 LAB — HEPATIC FUNCTION PANEL
ALT: 29 U/L (ref 0–44)
AST: 37 U/L (ref 15–41)
Albumin: 3.9 g/dL (ref 3.5–5.0)
Alkaline Phosphatase: 67 U/L (ref 38–126)
Bilirubin, Direct: <0.1 mg/dL (ref 0.0–0.2)
Total Bilirubin: 0.6 mg/dL (ref 0.3–1.2)
Total Protein: 7.2 g/dL (ref 6.5–8.1)

## 2020-02-19 LAB — BLOOD GAS, VENOUS
Acid-Base Excess: 3.5 mmol/L — ABNORMAL HIGH (ref 0.0–2.0)
Bicarbonate: 31.8 mmol/L — ABNORMAL HIGH (ref 20.0–28.0)
O2 Saturation: 68.3 %
Patient temperature: 98.6
pCO2, Ven: 71.3 mmHg (ref 44.0–60.0)
pH, Ven: 7.272 (ref 7.250–7.430)
pO2, Ven: 39.1 mmHg (ref 32.0–45.0)

## 2020-02-19 LAB — BRAIN NATRIURETIC PEPTIDE: B Natriuretic Peptide: 150.4 pg/mL — ABNORMAL HIGH (ref 0.0–100.0)

## 2020-02-19 LAB — MAGNESIUM
Magnesium: 2 mg/dL (ref 1.7–2.4)
Magnesium: 2.3 mg/dL (ref 1.7–2.4)

## 2020-02-19 LAB — CBG MONITORING, ED: Glucose-Capillary: 176 mg/dL — ABNORMAL HIGH (ref 70–99)

## 2020-02-19 LAB — TROPONIN I (HIGH SENSITIVITY): Troponin I (High Sensitivity): 7 ng/L (ref <18)

## 2020-02-19 LAB — SARS CORONAVIRUS 2 BY RT PCR (HOSPITAL ORDER, PERFORMED IN ~~LOC~~ HOSPITAL LAB): SARS Coronavirus 2: NEGATIVE

## 2020-02-19 MED ORDER — NALOXONE HCL 4 MG/0.1ML NA LIQD: 0.4000 mg | Freq: Once | NASAL | Status: DC

## 2020-02-19 MED ORDER — SUCCINYLCHOLINE CHLORIDE 200 MG/10ML IV SOSY
PREFILLED_SYRINGE | INTRAVENOUS | Status: AC
  Filled 2020-02-19: qty 10

## 2020-02-19 MED ORDER — NALOXONE HCL 0.4 MG/ML IJ SOLN: 0.4000 mg | INTRAMUSCULAR | Status: DC | PRN

## 2020-02-19 MED ORDER — STERILE WATER FOR INJECTION IJ SOLN
INTRAMUSCULAR | Status: AC
  Filled 2020-02-19: qty 10

## 2020-02-19 MED ORDER — PROPOFOL 500 MG/50ML IV EMUL
INTRAVENOUS | Status: AC
  Filled 2020-02-19: qty 50

## 2020-02-19 MED ORDER — MIDAZOLAM HCL 2 MG/2ML IJ SOLN
INTRAMUSCULAR | Status: AC
  Filled 2020-02-19: qty 4

## 2020-02-19 MED ORDER — ROCURONIUM BROMIDE 10 MG/ML (PF) SYRINGE
PREFILLED_SYRINGE | INTRAVENOUS | Status: AC
  Filled 2020-02-19: qty 10

## 2020-02-19 MED ORDER — ETOMIDATE 2 MG/ML IV SOLN
INTRAVENOUS | Status: AC
  Filled 2020-02-19: qty 20

## 2020-02-19 MED ORDER — NALOXONE HCL 2 MG/2ML IJ SOSY
1.0000 mg | PREFILLED_SYRINGE | Freq: Once | INTRAMUSCULAR | Status: AC
  Administered 2020-02-19: 1 mg via INTRAVENOUS

## 2020-02-19 MED ORDER — LIDOCAINE HCL (CARDIAC) PF 100 MG/5ML IV SOSY
PREFILLED_SYRINGE | INTRAVENOUS | Status: AC
  Filled 2020-02-19: qty 5

## 2020-02-19 MED ORDER — ACETAMINOPHEN 500 MG PO TABS
500.0000 mg | ORAL_TABLET | Freq: Once | ORAL | Status: AC
  Administered 2020-02-19: 500 mg via ORAL
  Filled 2020-02-19: qty 1

## 2020-02-19 NOTE — Discharge Instructions (Signed)
HERNIA REPAIR: POST OP INSTRUCTIONS  ######################################################################  EAT Gradually transition to a high fiber diet with a fiber supplement over the next few weeks after discharge.  Start with a pureed / full liquid diet (see below)  WALK Walk an hour a day.  Control your pain to do that.    CONTROL PAIN Control pain so that you can walk, sleep, tolerate sneezing/coughing, and go up/down stairs.  HAVE A BOWEL MOVEMENT DAILY Keep your bowels regular to avoid problems.  OK to try a laxative to override constipation.  OK to use an antidairrheal to slow down diarrhea.  Call if not better after 2 tries  CALL IF YOU HAVE PROBLEMS/CONCERNS Call if you are still struggling despite following these instructions. Call if you have concerns not answered by these instructions  ######################################################################    REPARACIN DE HERNIA: INSTRUCCIONES POSTOP ############################################## ###################   COME Haga una transicin gradual a una dieta alta en fibra con un suplemento de fibra durante las prximas semanas despus del alta. Comience con Ardelia Mems dieta en pur/lquida completa (ver ms abajo)   CAMINAR [place] una hora al da. Controla tu dolor para hacer eso.   CONTROLAR EL DOLOR Controle el dolor para que pueda caminar, dormir, Tree surgeon estornudos/tos y Risk manager.   TENGA UNA DEPOSICIN DIARIAMENTE Mantenga sus evacuaciones regulares para evitar problemas. Est bien probar un laxante para anular el estreimiento. Est bien usar un antidiarreico para Presenter, broadcasting. Llame si no mejora despus de 2 intentos  LLAME SI TIENE PROBLEMAS/INQUIETUDES Llame si todava tiene problemas a pesar de seguir estas instrucciones. Llame si tiene inquietudes que no han sido respondidas por estas instrucciones ############################################## ###################             share              1. DIET: Follow a light bland diet & liquids the first 24 hours after arrival home, such as soup, liquids, starches, etc.  Be sure to drink plenty of fluids.  Quickly advance to a usual solid diet within a few days.  Avoid fast food or heavy meals as your are more likely to get nauseated or have irregular bowels.  A low-fat, high-fiber diet for the rest of your life is ideal.   2. Take your usually prescribed home medications unless otherwise directed.  3. PAIN CONTROL: a. Pain is best controlled by a usual combination of three different methods TOGETHER: i. Ice/Heat ii. Over the counter pain medication iii. Prescription pain medication b. Most patients will experience some swelling and bruising around the hernia(s) such as the bellybutton, groins, or old incisions.  Ice packs or heating pads (30-60 minutes up to 6 times a day) will help. Use ice for the first few days to help decrease swelling and bruising, then switch to heat to help relax tight/sore spots and speed recovery.  Some people prefer to use ice alone, heat alone, alternating between ice & heat.  Experiment to what works for you.  Swelling and bruising can take several weeks to resolve.   c. It is helpful to take an over-the-counter pain medication regularly for the first few weeks.  Choose one of the following that works best for you: i. Naproxen (Aleve, etc)  Two 220mg  tabs twice a day ii. Ibuprofen (Advil, etc) Three 200mg  tabs four times a day (every meal & bedtime) iii. Acetaminophen (Tylenol, etc) 325-650mg  four times a day (every meal & bedtime) d. A  prescription for pain medication should be given to you  upon discharge.  Take your pain medication as prescribed.  i. If you are having problems/concerns with the prescription medicine (does not control pain, nausea, vomiting, rash, itching, etc), please call us [phone] to see if we need to switch you to a different pain  medicine that will work better for you and/or control your side effect better. ii. If you need a refill on your pain medication, please contact your pharmacy.  They will contact our office to request authorization. Prescriptions will not be filled after 5 pm or on week-ends.  4. Avoid getting constipated.  Between the surgery and the pain medications, it is common to experience some constipation.  Increasing fluid intake and taking a fiber supplement (such as Metamucil, Citrucel, FiberCon, MiraLax, etc) 1-2 times a day regularly will usually help prevent this problem from occurring.  A mild laxative (prune juice, Milk of Magnesia, MiraLax, etc) should be taken according to package directions if there are no bowel movements after 48 hours.    5. Wash / shower every day.  You may shower over the dressings as they are waterproof.    6. Remove your waterproof bandages, skin tapes, and other bandages 3 days after surgery. You may replace a dressing/Band-Aid to cover the incision for comfort if you wish. You may leave the incisions open to air.  You may replace a dressing/Band-Aid to cover an incision for comfort if you wish.  Continue to shower over incision(s) after the dressing is off.  7. ACTIVITIES as tolerated:   a. You may resume regular (light) daily activities beginning the next day--such as daily self-care, walking, climbing stairs--gradually increasing activities as tolerated.  Control your pain so that you can walk an hour a day.  If you can walk 30 minutes without difficulty, it is safe to try more intense activity such as jogging, treadmill, bicycling, low-impact aerobics, swimming, etc. b. Save the most intensive and strenuous activity for last such as sit-ups, heavy lifting, contact sports, etc  Refrain from any heavy lifting or straining until you are off narcotics for pain control.   c. DO NOT PUSH THROUGH PAIN.  Let pain be your guide: If it hurts to do something, don't do it.  Pain is your  body warning you to avoid that activity for another week until the pain goes down. d. You may drive when you are no longer taking prescription pain medication, you can comfortably wear a seatbelt, and you can safely maneuver your car and apply brakes. e. Dennis Bast may have sexual intercourse when it is comfortable.   8. FOLLOW UP in our office a. Please call [facility] at [phone] to set up an appointment to see your surgeon in the office for a follow-up appointment approximately 2-3 weeks after your surgery. b. Make sure that you call for this appointment the day you arrive home to insure a convenient appointment time.  9.  If you have disability of FMLA / Family leave forms, please bring the forms to the office for processing.  (do not give to your surgeon).  WHEN TO CALL us [phone]: 1. Poor pain control 2. Reactions / problems with new medications (rash/itching, nausea, etc)  3. Fever over 101.5 F (38.5 C) 4. Inability to urinate 5. Nausea and/or vomiting 6. Worsening swelling or bruising 7. Continued bleeding from incision. 8. Increased pain, redness, or drainage from the incision   The clinic staff is available to answer your questions during regular business hours (8:30am-5pm).  Please dont  hesitate to call and ask to speak to one of our nurses for clinical concerns.   If you have a medical emergency, go to the nearest emergency room or call 911.  A surgeon from [facility] is always on call at the hospitals in [facility], [place], [place], [place], [place]  [address] ?  [address] [address], [place], [place]   [address] MAIN: [phone] ? TOLL FREE: [phone] ? FAX: [phone] [email or web address]    [place] Textbook of Surgery (20th ed., pp. [phone]). Elsevier.">    Reparacin laparoscpica de una hernia ventral, cuidados posteriores Laparoscopic Ventral Hernia Repair, Care After La siguiente  informacin ofrece orientacin sobre cmo cuidarse despus del procedimiento. El mdico tambin podr darle instrucciones ms especficas. Comunquese con el mdico si tiene problemas o preguntas. Qu puedo esperar despus del procedimiento? Despus del procedimiento, es normal tener dolor, molestias o sensibilidad. Siga estas instrucciones en su casa: Medicamentos  Use los medicamentos de venta libre y los recetados solamente como se lo haya indicado el mdico.  Pregntele al mdico si el medicamento recetado: ? Hace necesario que evite conducir o Musician. ? Puede causarle estreimiento. Es posible que tenga que tomar estas medidas para prevenir o tratar el estreimiento:  Electronics engineer suficiente lquido como para Theatre manager la orina de color amarillo plido.  Usar medicamentos recetados o de Radio broadcast assistant.  Consumir alimentos ricos en fibra, como frijoles, cereales integrales, y frutas y verduras frescas.  Limitar el consumo de alimentos ricos en grasa y azcares procesados, como los alimentos fritos o dulces. Cuidado de la incisin  Siga las instrucciones del mdico acerca del cuidado de la incisin. Asegrese de hacer lo siguiente: ? Lvese las manos con agua y jabn durante al menos 20segundos antes y despus de cambiarse la venda (vendaje) o antes de tocarse el abdomen. Use desinfectante para manos si no dispone de [place]. ? Cambie el vendaje como se lo haya indicado el mdico. ? No retire los puntos (suturas), la goma para cerrar la piel o las tiras [place]. Es posible que estos cierres cutneos deban quedar puestos en la piel durante 2semanas o ms tiempo. Si los bordes de las tiras adhesivas empiezan a despegarse y Therapist, sports, puede recortar los que estn sueltos. No retire las tiras [facility] por completo a menos que el mdico se lo indique.  Psychiatric nurse de la incisin todos los das para detectar signos de infeccin. Est atento a los siguientes signos: ? Aumento  del enrojecimiento, la hinchazn o Conservation officer, historic buildings. ? Lquido o sangre. ? Calor. ? Pus o mal olor.   Baarse  No tome baos de inmersin, no nade ni use el jacuzzi hasta que el mdico lo autorice. Pregntele al mdico si puede ducharse. Thurston Pounds solo le permitan darse baos de [place].  Mantenga seco el vendaje hasta que el mdico le diga que se lo puede quitar.   Actividad  Haga reposo como se lo haya indicado el mdico.  Evite estar sentado durante largos perodos sin moverse. Levntese y camine un poco cada 1 a 2 horas. Esto es importante para mejorar el flujo sanguneo y la respiracin. Pida ayuda si se siente dbil o inestable.  No levante ningn objeto que pese ms de10 libras (4.5kg) o que supere el lmite de peso que le hayan indicado, hasta que el mdico le diga que puede [place].  Si le administraron un sedante durante el procedimiento, puede afectarlo por varias horas. No conduzca  ni opere maquinaria hasta que el mdico le indique que es seguro [place].  Retome sus actividades normales segn lo indicado por el mdico. Pregntele al mdico qu actividades son seguras para usted.   Indicaciones generales  Sostenga una almohada sobre el abdomen cuando tosa o estornude. Esto ayuda a Best boy.  No consuma ningn producto que contenga nicotina o tabaco. Estos productos incluyen cigarrillos, tabaco para Higher education careers adviser y aparatos de vapeo, como los Psychologist, sport and exercise. Estos pueden retrasar la cicatrizacin despus de la ciruga. Si necesita ayuda para dejar de consumir estos productos, consulte al [employer].  Posiblemente le pidan que contine realizando ejercicios de respiracin profunda en su hogar. Esto ayudar a Doctor, general practice.  Cumpla con todas las visitas de seguimiento. Esto es importante.   Comunquese con un mdico si:  Tiene cualquiera de estos signos de infeccin: ? Ms enrojecimiento, hinchazn o dolor alrededor de una incisin. ? Sangre o lquido  provenientes de una incisin. ? Calor proveniente de una incisin. ? Pus o mal olor provenientes de una incisin. ? Cristy Hilts o escalofros.  Tiene un dolor que empeora o que no mejora con los medicamentos.  Tiene nuseas o vmitos.  Tiene tos.  Le falta el aire.  No defeca durante 3das.  No puede orinar. Solicite ayuda de inmediato si tiene:  Dolor intenso en el abdomen.  Nuseas o vmitos persistentes.  Enrojecimiento, calor o dolor en la pierna.  Dolor de pecho.  Dificultad para respirar. Estos sntomas pueden representar un problema grave que constituye Engineer, maintenance (IT). No espere a ver si los sntomas desaparecen. Solicite atencin mdica de inmediato. Comunquese con el servicio de emergencias de su localidad (911 en los [place]). No conduzca por sus propios medios Principal Financial. Resumen  Despus de este procedimiento, es normal tener dolor, molestias o sensibilidad.  Siga las instrucciones del mdico acerca del cuidado de la incisin.  Controle la zona de la incisin todos los das para detectar signos de infeccin. Avsele al mdico sobre cualquier signo de infeccin.  Cumpla con todas las visitas de seguimiento. Esto es importante. Esta informacin no tiene Marine scientist el consejo del mdico. Asegrese de hacerle al mdico cualquier pregunta que tenga. Document Revised: 11/13/2019 Document Reviewed: 11/13/2019 Elsevier Patient Education  2021 [employer].

## 2020-02-19 NOTE — ED Triage Notes (Signed)
Pt arrives via [facility] from Outpatient desk. Per Rapid RN- a rapid response was called due to pt being unresponsive. Pt was brought to ED. Pt had minimal response to painful stimuli. Pt is pale and diaphoretic. Son is at bedside. Pt moved to ED stretcher via hoyer lift. Dykstra MD to bedside. Pt placed on monitor. Pts RA o2 sat 28% with even waveform.Staff began to bag pt via bag valve mask. Respiratory called to pt bedside. RSI kit pulled and pt set up for intubation. Pts CBG 178. Intranasal narcan administered at [place]. No response. IV access obtained. 30m IV narcan administered @ 1700. Pt became responsive. Pt is alert at this time and speaking with family at bedside.

## 2020-02-19 NOTE — Progress Notes (Signed)
Patient declines continuous BiPAP at this time. Easy to arouse and stable VS on nasal canula.

## 2020-02-19 NOTE — ED Provider Notes (Signed)
[facility] DEPT Provider Note   CSN: [id number] Arrival date & time: 02/19/20  1650     History Chief Complaint  Patient presents with  . Unresponsive    Jody Fitzgerald is a 65 y.o. female.  History limited due to altered mental status.  Reviewed chart, recent admission to general surgery for elective repair of incisional flank abdominal wall hernia.  Laparoscopic repair.  Underwent procedure yesterday, no documented complications.  This afternoon patient was discharged.  After discharge while awaiting ride, patient became suddenly unresponsive.  Rushed to ER by rapid response.  On arrival in ER, patient unable to provide any history due to her unresponsive state.  HPI     Past Medical History:  Diagnosis Date  . Asthma   . Cancer ([place])    cervix  . 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 Hypertension   . Pre-diabetes     Patient Active Problem List   Diagnosis Date Noted  . Incisional hernia of right flank s/p lap repair w mesh 02/18/2020 02/18/2020  . Uses Spanish as primary spoken language 11/24/2019  . Incisional hernia 11/24/2019  . 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 History of right nephrectomy 11/24/2019  . Obesity (BMI 30-39.9) 11/24/2019    Past Surgical History:  Procedure Laterality Date  . ABDOMINAL HYSTERECTOMY    . TOTAL NEPHRECTOMY Right   . VENTRAL HERNIA REPAIR Right 02/18/2020   Procedure: LAPAROSCOPIC VENTRAL WALL HERNIA REPAIR WITH  LYSIS OF ADHESIONS;  Surgeon: Michael Boston, MD;  Location: [facility]S;  Service: General;  Laterality: Right;     OB History   No obstetric history on file.     History reviewed. No pertinent family history.  Social History   Tobacco Use  . Smoking status: Former Smoker    Types: Cigarettes  . Smokeless tobacco: Never Used  . Tobacco comment: stop seven years ago  Vaping Use  . Vaping Use: Never used  Substance Use Topics  . Alcohol  use: Never  . Drug use: Never    Home Medications Prior to Admission medications   Medication Sig Start Date End Date Taking? Authorizing Provider  amLODipine (NORVASC) 5 MG tablet Take 5 mg by mouth daily. 10/14/19  Yes [provider]  lisinopril-hydrochlorothiazide (ZESTORETIC) 20-25 MG tablet Take 1 tablet by mouth daily. 10/14/19  Yes [provider]  methocarbamol (ROBAXIN) 750 MG tablet Take 1 tablet (750 mg total) by mouth 4 (four) times daily as needed (use for muscle cramps/pain). Patient taking differently: Take 750 mg by mouth 4 (four) times daily as needed for muscle spasms. 02/18/20   Michael Boston, MD  oxyCODONE (OXY IR/ROXICODONE) 5 MG immediate release tablet Take 1-2 tablets (5-10 mg total) by mouth every 6 (six) hours as needed for moderate pain, severe pain or breakthrough pain. 02/18/20   Michael Boston, MD    Allergies    Nsaids  Review of Systems   Review of Systems  Unable to perform ROS: Mental status change    Physical Exam Updated Vital Signs BP (!) 136/94   Pulse 80   Resp 12   SpO2 92%   Physical Exam Vitals and nursing note reviewed.  Constitutional:      Appearance: She is well-developed and well-nourished.     Comments: Lethargic, near unresponsive  HENT:     Head: Normocephalic and atraumatic.  Eyes:     Conjunctiva/sclera: Conjunctivae normal.  Comments: Pupils 31mm  Cardiovascular:     Rate and Rhythm: Normal rate and regular rhythm.     Heart sounds: No murmur heard.   Pulmonary:     Comments: Very slow respirations, disordered breathing, breath sounds present Abdominal:     Palpations: Abdomen is soft.     Tenderness: There is no abdominal tenderness.  Musculoskeletal:        General: No edema.     Cervical back: Neck supple.  Skin:    General: Skin is warm and dry.  Neurological:     Comments: Very lethargic, opens eyes to painful stimuli, localizes pain, incomprehensible sounds  Psychiatric:        Mood and  Affect: Mood and affect normal.     ED Results / Procedures / Treatments   Labs (all labs ordered are listed, but only abnormal results are displayed) Labs Reviewed  CBC WITH DIFFERENTIAL/PLATELET - Abnormal; Notable for the following components:      Result Value   WBC 12.6 (*)    MCV 101.2 (*)    Neutro Abs 8.0 (*)    Monocytes Absolute 1.4 (*)    All other components within normal limits  CBG MONITORING, ED - Abnormal; Notable for the following components:   Glucose-Capillary 176 (*)    All other components within normal limits  I-STAT CHEM 8, ED - Abnormal; Notable for the following components:   Sodium 134 (*)    Chloride 96 (*)    Creatinine, Ser 1.40 (*)    Glucose, Bld 190 (*)    All other components within normal limits  SARS CORONAVIRUS 2 BY RT PCR (HOSPITAL ORDER, [place] [facility])  BASIC METABOLIC PANEL  RAPID URINE DRUG SCREEN, HOSP PERFORMED  URINALYSIS, ROUTINE W REFLEX MICROSCOPIC  BRAIN NATRIURETIC PEPTIDE  I-STAT CHEM 8, ED    EKG EKG Interpretation  Date/Time:  Thursday February 19 2020 16:58:53 EST Ventricular Rate:  86 PR Interval:    QRS Duration: 102 QT Interval:  383 QTC Calculation: 459 R Axis:   -8 Text Interpretation: Sinus rhythm LVH with secondary repolarization abnormality Confirmed by Madalyn Rob [phone]) on 02/19/2020 6:39:48 PM   Radiology DG Chest Portable 1 View  Result Date: 02/19/2020 CLINICAL DATA:  Shortness of breath, unresponsive EXAM: PORTABLE CHEST 1 VIEW COMPARISON:  None. FINDINGS: Cardiac shadow is enlarged but accentuated by the portable technique. Left mid lung atelectatic changes are noted. Some central vascular congestion is noted which may be related to a poor inspiratory effort. No focal confluent infiltrate is seen. No bony abnormality is noted. IMPRESSION: Mild vascular congestion which may be accentuated by the poor inspiratory effort. Mild left mid lung atelectatic changes. Electronically  Signed   By: Inez Catalina M.D.   On: 02/19/2020 17:46    Procedures .Critical Care Performed by: Lucrezia Starch, MD Authorized by: Lucrezia Starch, MD   Critical care provider statement:    Critical care time (minutes):  45   Critical care was necessary to treat or prevent imminent or life-threatening deterioration of the following conditions:  Respiratory failure   Critical care was time spent personally by me on the following activities:  Discussions with consultants, evaluation of patient's response to treatment, examination of patient, ordering and performing treatments and interventions, ordering and review of laboratory studies, ordering and review of radiographic studies, pulse oximetry, re-evaluation of patient's condition, obtaining history from patient or surrogate and review of old charts   (including critical care  time)  Medications Ordered in ED Medications  naloxone (NARCAN) nasal spray 4 mg/0.1 mL (0 mg Nasal Hold 02/19/20 1839)  acetaminophen (TYLENOL) tablet 500 mg (has no administration in time range)  naloxone (NARCAN) injection 1 mg (1 mg Intravenous Given 02/19/20 1700)    ED Course  I have reviewed the triage vital signs and the nursing notes.  Pertinent labs & imaging results that were available during my care of the patient were reviewed by me and considered in my medical decision making (see chart for details).  Clinical Course as of 02/19/20 1949  Thu Feb 19, 2020  1939 D/w Marlou Starks - requests medicine admit, will notify Gross to see in am [RD]    Clinical Course User Index [RD] Lucrezia Starch, MD   MDM Rules/Calculators/A&P                         65 year old lady presents to ER after being found unresponsive, recent discharge from hospital for laparoscopic repair of abdominal wall hernia.  On arrival to ER, patient unresponsive, very lethargic, very slow respirations, profoundly hypoxic.  Performed airway repositioning, rescue breathing with Ambu  bag.  Had mild improvement with intranasal Narcan but still very lethargic, after IV access was obtained she was given a dose of IV Narcan and had significant improvement in her mental status as well as respiratory rate.  O2 saturations improved dramatically as well.  Was titrated down to nasal cannula.  Suspect accidental overdose from opiates complicated by her morbid obesity, possible obesity hypoventilation.  CXR without clear acute findings.  EKG without acute change, electrolytes grossly stable.  On reassessment, she is alert and not in respiratory distress but is consistently mildly hypoxic on room air.  Believe she would benefit from supplemental nasal cannula and further observation this evening.  I reviewed case with on-call for general surgery, they will see patient in consultation but request medicine admission at this time.  Final Clinical Impression(s) / ED Diagnoses Final diagnoses:  Acute respiratory failure with hypoxia (HCC)  Opiate overdose, accidental or unintentional, initial encounter [facility])    Rx / DC Orders ED Discharge Orders    None       Lucrezia Starch, MD 02/19/20 1949

## 2020-02-19 NOTE — ED Notes (Signed)
Dykstra MD turned pts O2 off. Pts sat dropped to 87% on RA. Pts o2 increased back to 2L o2 via [place].

## 2020-02-19 NOTE — ED Notes (Signed)
Lab called to add on blood specimens that were sent

## 2020-02-19 NOTE — ED Notes (Signed)
Patient requesting food, Dr. Roslynn Amble said that she can.

## 2020-02-19 NOTE — H&P (Signed)
[name] [id number] DOB: Feb 12, 1955 DOA: 02/19/2020     PCP: Patient, No Pcp Per   Outpatient Specialists:  NONE    Patient arrived to ER on 02/19/20 at 1650 Referred by Attending Lucrezia Starch, MD   Patient coming from: home Lives alone,   Chief Complaint:   Chief Complaint  Patient presents with  . Unresponsive    HPI: Jody Fitzgerald is a 65 y.o. female with medical history significant of obesity recent hernia repair, asthma, CKD stage III status post nephrectomy, HTN, prediabetes    Presented with found unresponsive pale and diaphoretic.  Minimal response to painful stimuli in the hospital lobby with O2 sats down to 30% patient was bagged and prepared for intubation.  CBG was 178 intranasal Narcan was administered at 4:45 PM initiated Response after IV was obtained 2 mg of IV Narcan was administered at 1700 and patient became responsive.  Was able to communicate clearly with the family and physician requesting pain medication. Of note: Patient was admitted on 19 January for hernia repair.  Down by Dr. Johney Maine.  Tolerated procedure well was able to mobilize postoperatively and discharged the next day on the 20th. Hip pain was treated with Roxicodone as well as oxycodone 5-10 every 4 as needed as well as fentanyl Patient received last dose of oxycodone 10 mg at 1547 on 20 January Her last dose of fentanyl was on 19 January at 1740 3 PM 25 mcg of given at that time  Patient states at her baseline she only takes Tylenol for pain  Infectious risk factors:  Reports none     Initial COVID TEST  NEGATIVE   Lab Results  Component Value Date   [place] NEGATIVE 02/19/2020   [place] NEGATIVE 02/14/2020     Regarding pertinent Chronic problems:     Hyperlipidemia -  on statins lipitor Lipid Panel  No results found for: CHOL, TRIG, HDL, CHOLHDL, VLDL, LDLCALC, LDLDIRECT, LABVLDL   HTN on Norvasc lisinopril/hydrochlorothiazide   Asthma -well   controlled  on home inhalers/         CKD stage III - baseline Cr 1.13 Estimated Creatinine Clearance: 50.9 mL/min (A) (by C-G formula based on SCr of 1.4 mg/dL (H)).  Lab Results  Component Value Date   CREATININE 1.40 (H) 02/19/2020   CREATININE 0.99 02/19/2020   CREATININE 1.13 (H) 02/18/2020     While in ER: Patient initially unresponsive requiring bagging received intranasal Narcan followed by IV Narcan.  Excellent response became more alert and oriented. Still has some residual hypoxia requiring up to 2 to 3 L of oxygen At baseline patient does not require any oxygen Chest x-ray showing just some congestion but no evidence of infiltrate   ER Provider Called: General surgery     They Recommend admit to medicine  Will see in AM  Hospitalist was called for admission for unintentional opioid overdose Resulting in acute respiratory failure with hypoxia The following Work up has been ordered so far:  Orders Placed This Encounter  Procedures  . Critical Care  . SARS Coronavirus 2 by RT PCR (hospital order, performed in [facility] hospital lab) Nasopharyngeal Nasopharyngeal Swab  . DG Chest Portable 1 View  . CBC with Differential  . Basic metabolic panel  . Rapid urine drug screen (hospital performed)  . Urinalysis, Routine w reflex microscopic  . Brain natriuretic peptide  . Cardiac monitoring  . Consult to general surgery  ALL PATIENTS BEING ADMITTED/HAVING PROCEDURES NEED COVID-19 SCREENING  .  Consult to hospitalist  ALL PATIENTS BEING ADMITTED/HAVING PROCEDURES NEED COVID-19 SCREENING  . Pulse oximetry, continuous  . CBG monitoring, ED  . I-stat chem 8, ed  . I-stat chem 8, ED (not at [facility] or [facility])  . ED EKG    Following Medications were ordered in ER: Medications  naloxone (NARCAN) nasal spray 4 mg/0.1 mL (0 mg Nasal Hold 02/19/20 1839)  naloxone [facility]) injection 1 mg (1 mg Intravenous Given 02/19/20 1700)  acetaminophen (TYLENOL) tablet 500 mg (500 mg Oral Given 02/19/20  1951)        Consult Orders  (From admission, onward)         Start     Ordered   02/19/20 1940  Consult to hospitalist  ALL PATIENTS BEING ADMITTED/HAVING PROCEDURES NEED COVID-19 SCREENING  Once       Comments: ALL PATIENTS BEING ADMITTED/HAVING PROCEDURES NEED COVID-19 SCREENING  Provider:  (Not yet assigned)  Question Answer Comment  Place call to: [facility]   Reason for Consult Admit      02/19/20 1939          Significant initial  Findings: Abnormal Labs Reviewed  CBC WITH DIFFERENTIAL/PLATELET - Abnormal; Notable for the following components:      Result Value   WBC 12.6 (*)    MCV 101.2 (*)    Neutro Abs 8.0 (*)    Monocytes Absolute 1.4 (*)    All other components within normal limits  CBG MONITORING, ED - Abnormal; Notable for the following components:   Glucose-Capillary 176 (*)    All other components within normal limits  I-STAT CHEM 8, ED - Abnormal; Notable for the following components:   Sodium 134 (*)    Chloride 96 (*)    Creatinine, Ser 1.40 (*)    Glucose, Bld 190 (*)    All other components within normal limits    Otherwise labs showing:    Recent Labs  Lab 02/18/20 2002 02/19/20 0535 02/19/20 1705  NA  --  136 134*  K  --  4.4 4.0  CO2  --  28  --   GLUCOSE  --  130* 190*  BUN  --  12 21  CREATININE 1.13* 0.99 1.40*  CALCIUM  --  8.8*  --   MG  --  2.0  --     Cr     Up from baseline see below Lab Results  Component Value Date   CREATININE 1.40 (H) 02/19/2020   CREATININE 0.99 02/19/2020   CREATININE 1.13 (H) 02/18/2020    No results for input(s): AST, ALT, ALKPHOS, BILITOT, PROT, ALBUMIN in the last 168 hours. Lab Results  Component Value Date   CALCIUM 8.8 (L) 02/19/2020     WBC      Component Value Date/Time   WBC 12.6 (H) 02/19/2020 1659   LYMPHSABS 3.1 02/19/2020 1659   MONOABS 1.4 (H) 02/19/2020 1659   EOSABS 0.0 02/19/2020 1659   BASOSABS 0.0 02/19/2020 1659    Plt: Lab Results  Component Value  Date   PLT 295 02/19/2020      Venous  Blood Gas result:  pH  7.272  PCO2 71.3     ABG    Component Value Date/Time   TCO2 30 02/19/2020 1705      HG/HCT  stable,       Component Value Date/Time   HGB 14.3 02/19/2020 1705   HCT 42.0 02/19/2020 1705   MCV 101.2 (H) 02/19/2020 1659  Troponin 7     ECG: Ordered Personally reviewed by me showing: HR : 86  Rhythm:  NSR, secondary repolarization abnormality LVH  nonspecific changes  QTC 459   BNP (last 3 results) Recent Labs    02/19/20 1659  BNP 150.4*      DM  labs:  HbA1C: Recent Labs    02/09/20 1344  HGBA1C 5.5       CBG (last 3)  Recent Labs    02/19/20 1651  GLUCAP 176*     UA ordered   Urine analysis: No results found for: COLORURINE, APPEARANCEUR, LABSPEC, PHURINE, GLUCOSEU, HGBUR, BILIRUBINUR, KETONESUR, PROTEINUR, UROBILINOGEN, NITRITE, LEUKOCYTESUR     Ordered   CXR -mild vascular congestion    ED Triage Vitals  Enc Vitals Group     BP 02/19/20 1640 135/89     Pulse Rate 02/19/20 1640 90     Resp 02/19/20 1640 (!) 7     Temp 02/19/20 1951 99.7 F (37.6 C)     Temp Source 02/19/20 1951 Oral     SpO2 02/19/20 1640 (!) 28 %     Weight --      Height --      Head Circumference --      Peak Flow --      Pain Score 02/19/20 1721 0     Pain Loc --      Pain Edu? --      Excl. in [place]? --   TMAX(24)@       Latest  Blood pressure (!) 131/102, pulse 84, temperature 99.7 F (37.6 C), temperature source Oral, resp. rate 17, SpO2 94 %.    Review of Systems:    Pertinent positives include:  fatigue postsurgical abdominal pain  Constitutional:  No weight loss, night sweats, Fevers, chills,, weight loss  HEENT:  No headaches, Difficulty swallowing,Tooth/dental problems,Sore throat,  No sneezing, itching, ear ache, nasal congestion, post nasal drip,  Cardio-vascular:  No chest pain, Orthopnea, PND, anasarca, dizziness, palpitations.no Bilateral lower extremity swelling  GI:  No  heartburn, indigestion, abdominal pain, nausea, vomiting, diarrhea, change in bowel habits, loss of appetite, melena, blood in stool, hematemesis Resp:  no shortness of breath at rest. No dyspnea on exertion, No excess mucus, no productive cough, No non-productive cough, No coughing up of blood.No change in color of mucus.No wheezing. Skin:  no rash or lesions. No jaundice GU:  no dysuria, change in color of urine, no urgency or frequency. No straining to urinate.  No flank pain.  Musculoskeletal:  No joint pain or no joint swelling. No decreased range of motion. No back pain.  Psych:  No change in mood or affect. No depression or anxiety. No memory loss.  Neuro: no localizing neurological complaints, no tingling, no weakness, no double vision, no gait abnormality, no slurred speech, no confusion  All systems reviewed and apart from [place] all are negative  Past Medical History:   Past Medical History:  Diagnosis Date  . Asthma   . Cancer ([place])    cervix  . 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 Hypertension   . Pre-diabetes       Past Surgical History:  Procedure Laterality Date  . ABDOMINAL HYSTERECTOMY    . TOTAL NEPHRECTOMY Right   . VENTRAL HERNIA REPAIR Right 02/18/2020   Procedure: LAPAROSCOPIC VENTRAL WALL HERNIA REPAIR WITH  LYSIS OF ADHESIONS;  Surgeon: Michael Boston, MD;  Location: [facility];  Service: General;  Laterality: Right;    Social History:  Ambulatory independently      reports that she has quit smoking. Her smoking use included cigarettes. She has never used smokeless tobacco. She reports that she does not drink alcohol and does not use drugs.   Family History:   Family History  Problem Relation Age of Onset  . Diabetes Other     Allergies: Allergies  Allergen Reactions  . Nsaids     Avoid NSAIDS related to nephrectomy.     Prior to Admission medications   Medication Sig Start Date End Date Taking? Authorizing  Provider  amLODipine (NORVASC) 5 MG tablet Take 5 mg by mouth daily. 10/14/19  Yes [provider]  lisinopril-hydrochlorothiazide (ZESTORETIC) 20-25 MG tablet Take 1 tablet by mouth daily. 10/14/19  Yes [provider]  methocarbamol (ROBAXIN) 750 MG tablet Take 1 tablet (750 mg total) by mouth 4 (four) times daily as needed (use for muscle cramps/pain). Patient taking differently: Take 750 mg by mouth 4 (four) times daily as needed for muscle spasms. 02/18/20   Michael Boston, MD  oxyCODONE (OXY IR/ROXICODONE) 5 MG immediate release tablet Take 1-2 tablets (5-10 mg total) by mouth every 6 (six) hours as needed for moderate pain, severe pain or breakthrough pain. 02/18/20   Michael Boston, MD   Physical Exam: Vitals with BMI 02/19/2020 02/19/2020 02/19/2020  Height - - -  Weight - - -  BMI - - -  Systolic [id number]  Diastolic A999333 [id number]  Pulse 84 80 80     1. General:  in No  Acute distress   Chronically ill  -appearing 2. Psychological: Alert and Oriented 3. Head/ENT:    Dry Mucous Membranes                          Head Non traumatic, neck supple                            Poor Dentition 4. SKIN: normal   Skin turgor,  Skin clean Dry and intact no rash 5. Heart: Regular rate and rhythm no  Murmur, no Rub or gallop 6. Lungs: Distant no wheezes or crackles   7. Abdomen: Soft, appropriately postsurgically tender, Non distended obese  8. Lower extremities: no clubbing, cyanosis, no  edema 9. Neurologically Grossly intact, moving all 4 extremities equally  10. MSK: Normal range of motion   All other LABS:     Recent Labs  Lab 02/18/20 2002 02/19/20 0535 02/19/20 1659 02/19/20 1705  WBC 11.7* 9.5 12.6*  --   NEUTROABS  --   --  8.0*  --   HGB 13.4 12.5 12.8 14.3  HCT 43.0 39.1 41.8 42.0  MCV 99.5 96.5 101.2*  --   PLT 240 255 295  --      Recent Labs  Lab 02/18/20 2002 02/19/20 0535 02/19/20 1705  NA  --  136 134*  K  --  4.4 4.0  CL  --  98 96*  CO2   --  28  --   GLUCOSE  --  130* 190*  BUN  --  12 21  CREATININE 1.13* 0.99 1.40*  CALCIUM  --  8.8*  --   MG  --  2.0  --      No results for input(s): AST, ALT, ALKPHOS, BILITOT, PROT, ALBUMIN in the last 168 hours.  Cultures: No results found for: SDES, SPECREQUEST, CULT, REPTSTATUS   Radiological Exams on Admission: DG Chest Portable 1 View  Result Date: 02/19/2020 CLINICAL DATA:  Shortness of breath, unresponsive EXAM: PORTABLE CHEST 1 VIEW COMPARISON:  None. FINDINGS: Cardiac shadow is enlarged but accentuated by the portable technique. Left mid lung atelectatic changes are noted. Some central vascular congestion is noted which may be related to a poor inspiratory effort. No focal confluent infiltrate is seen. No bony abnormality is noted. IMPRESSION: Mild vascular congestion which may be accentuated by the poor inspiratory effort. Mild left mid lung atelectatic changes. Electronically Signed   By: Inez Catalina M.D.   On: 02/19/2020 17:46    Chart has been reviewed    Assessment/Plan   65 y.o. female with medical history significant of obesity recent hernia repair, asthma, CKD stage III status post nephrectomy, HTN, prediabetes   Admitted for  unintentional opioid overdose Resulting in acute respiratory failure with hypoxia  Present on Admission: . Acute respiratory failure with hypoxia and hypercapnia (HCC) -likely as a result of oversedation in opioid nave individual with most likely underlying obesity hypoventilation syndrome  this patient has acute respiratory failure with Hypoxia and  Hypercarbia as documented by the presence of following: O2 saturatio< 90% on RA  pH <7.35 with pCO2 >50    Provide O2 therapy and titrate as needed  Continuous pulse ox   check Pulse ox with ambulation prior to discharge   may need  TC consult for home O2 set up Additional Narcan as needed Given persistent hypercarbia and patient still feeling somewhat somnolent will order BiPAP  for tonight repeat VBG as needed Patient would likely benefit from official evaluation for obesity hypoventilation syndrome as an outpatient    . Obesity (BMI 30-39.9) -will need outpatient follow-up with nutrition  . Incisional hernia of right flank s/p lap repair w mesh 02/18/2020 appreciate general surgery involvement current appears to be stable  . Opioid overdose ([place]) -unintentional in the setting of suspected obesity hypoventilation syndrome and opioid medication.  We will try to stay away from opioids if able to control pain patient states she has done well in the past with Tylenol  Mild AKI hold ACE inhibitor gently rehydrate and follow   History of CKD stage III patient is status post nephrectomy  -chronic avoid nephrotoxic medications such as NSAIDs, Vanco Zosyn combo,  avoid hypotension, continue to follow renal function  Other plan as per orders.  DVT prophylaxis:   Lovenox       Code Status:    Code Status: Prior FULL CODE as per patient   I had personally discussed CODE STATUS with patient      Family Communication:   Family not at  Bedside    Disposition Plan:        To home once workup is complete and patient is stable   Following barriers for discharge:                            Electrolytes corrected                                                        Pain controlled with PO medications  white count improving                                                        Will likely need home health, home O2, set up                           Will need consultants to evaluate patient prior to discharge                      Would benefit from PT/OT eval prior to DC  Ordered                                      Consults called:  General surgery is aware  Admission status:  ED Disposition    ED Disposition Condition [place]: [facility] [100102]  Level of Care: Telemetry [5]  Admit to tele  based on following criteria: Monitor for Ischemic changes  Covid Evaluation: Confirmed COVID Negative  Diagnosis: Acute respiratory failure ([place]) [518.81.ICD-9-CM]  Admitting Physician: Toy Baker [[id number]]  Attending Physician: Toy Baker [[id number]]        Obs   Level of care   tele  For   24H    Lab Results  Component Value Date   Purple [place] 02/14/2020     Precautions: admitted as  Covid Negative     PPE: Used by the provider:   P100  eye Goggles,  Gloves     Shynia Daleo 02/19/2020, 10:14 PM    [facility]     after 2 AM please page floor coverage PA If 7AM-7PM, please contact the day team taking care of the patient using [email or web address]   Patient was evaluated in the context of the global COVID-19 pandemic, which necessitated consideration that the patient might be at risk for infection with the SARS-CoV-2 virus that causes COVID-19. Institutional protocols and algorithms that pertain to the evaluation of patients at risk for COVID-19 are in a state of rapid change based on information released by regulatory bodies including the CDC and federal and state organizations. These policies and algorithms were followed during the patient's care.

## 2020-02-19 NOTE — Progress Notes (Signed)
Discharge instructions discussed with patient, via use of interpreter, all questions answered, verbalized agreement and understanding.

## 2020-02-19 NOTE — ED Notes (Signed)
Lab called r/t missing results

## 2020-02-19 NOTE — Discharge Summary (Signed)
Physician Discharge Summary    Patient ID: Jody Fitzgerald MRN: [id number] DOB/AGE: [birth year]  65 y.o.  Patient Care Team: Patient, No Pcp Per as PCP - General (General Practice) Evlyn Clines, MD as Referring Physician (Cardiology) Karie Soda, MD as Consulting Physician (General Surgery) Eleanora Neighbor, MD as Referring Physician (Gastroenterology)  Admit date: 02/18/2020  Discharge date: 02/19/2020  Hospital Stay = 0 days    Discharge Diagnoses:  Principal Problem:   Incisional hernia of right flank s/p lap repair w mesh 02/18/2020 Active Problems:   Uses Spanish as primary spoken language   History of cancer of vagina 2010   History of right nephrectomy   Obesity (BMI 30-39.9)   1 Day Post-Op  02/18/2020  POST-OPERATIVE DIAGNOSIS:   VENTRAL INCISIONAL FLANK ABDOMINAL WALL  SURGERY:  02/18/2020  Procedure(s): LAPAROSCOPIC VENTRAL WALL HERNIA REPAIR WITH  LYSIS OF ADHESIONS  SURGEON:    Surgeon(s): Karie Soda, MD  Consults: None  Hospital Course:   The patient underwent the surgery above.  Postoperatively, the patient gradually mobilized and advanced to a solid diet.  Pain and other symptoms were treated aggressively.    By the time of discharge, the patient was walking well the hallways, eating food, having flatus.  Pain was well-controlled on an oral medications.  Based on meeting discharge criteria and continuing to recover, I felt it was safe for the patient to be discharged from the hospital to further recover with close followup. Postoperative recommendations were discussed in detail.  They are written as well.  Discharged Condition: good  Discharge Exam: Blood pressure 127/82, pulse 80, temperature 98.6 F (37 C), temperature source Oral, resp. rate 16, height 5\' 6"  (1.676 m), weight 109.8 kg, SpO2 95 %.  General: Pt awake/alert/oriented x4 in No acute distress Eyes: PERRL, normal EOM.  Sclera clear.  No icterus Neuro: CN II-XII intact w/o focal  sensory/motor deficits. Lymph: No head/neck/groin lymphadenopathy Psych:  No delerium/psychosis/paranoia HENT: Normocephalic, Mucus membranes moist.  No thrush Neck: Supple, No tracheal deviation Chest: No chest wall pain w good excursion CV:  Pulses intact.  Regular rhythm MS: Normal AROM mjr joints.  No obvious deformity Abdomen: Soft.  Nondistended.  Mildly tender at incisions only.  No evidence of peritonitis.  No incarcerated hernias. Ext:  SCDs BLE.  No mjr edema.  No cyanosis Skin: No petechiae / purpura   Disposition:    Follow-up Information    Karie Soda, MD. Schedule an appointment as soon as possible for a visit in 3 weeks.   Specialty: General Surgery Why: To follow up after your operation, To follow up after your hospital stay Contact information: [address] [place] [address] [phone]               Discharge disposition: 01-Home or Self Care       Discharge Instructions    Call MD for:   Complete by: As directed    FEVER > 101.5 F  (temperatures < 101.5 F are not significant)   Call MD for:  extreme fatigue   Complete by: As directed    Call MD for:  persistant dizziness or light-headedness   Complete by: As directed    Call MD for:  persistant nausea and vomiting   Complete by: As directed    Call MD for:  redness, tenderness, or signs of infection (pain, swelling, redness, odor or green/yellow discharge around incision site)   Complete by: As directed    Call MD  for:  severe uncontrolled pain   Complete by: As directed    Diet - low sodium heart healthy   Complete by: As directed    Start with a bland diet such as soups, liquids, starchy foods, low fat foods, etc. the first few days at home. Gradually advance to a solid, low-fat, high fiber diet by the end of the first week at home.   Add a fiber supplement to your diet (Metamucil, etc) If you feel full, bloated, or constipated, stay on a full liquid or  pureed/blenderized diet for a few days until you feel better and are no longer constipated.   Discharge instructions   Complete by: As directed    See Discharge Instructions If you are not getting better after two weeks or are noticing you are getting worse, contact our office [phone] for further advice.  We may need to adjust your medications, re-evaluate you in the office, send you to the emergency room, or see what other things we can do to help. The clinic staff is available to answer your questions during regular business hours (8:30am-5pm).  Please don't hesitate to call and ask to speak to one of our nurses for clinical concerns.    A surgeon from [facility] is always on call at the hospitals 24 hours/day If you have a medical emergency, go to the nearest emergency room or call 911.   Discharge wound care:   Complete by: As directed    It is good for closed incisions and even open wounds to be washed every day.  Shower every day.  Short baths are fine.  Wash the incisions and wounds clean with soap & water.    You may leave closed incisions open to air if it is dry.   You may cover the incision with clean gauze & replace it after your daily shower for comfort.  STERISTRIPS:  You have skin tapes called Steristrips on your incisions.  Leave them in place, and they will fall off on their own like a scab in 1-3 weeks.  You may trim any edges that curl up with clean scissors.  You may remove them in the shower in a week.  TEGADERM:  You have clear gauze band-aid dressings over your closed incision(s).  Remove the dressings 3 days after surgery.   Driving Restrictions   Complete by: As directed    You may drive when: - you are no longer taking narcotic prescription pain medication - you can comfortably wear a seatbelt - you can safely make sudden turns/stops without pain.   Increase activity slowly   Complete by: As directed    Start light daily activities --- self-care,  walking, climbing stairs- beginning the day after surgery.  Gradually increase activities as tolerated.  Control your pain to be active.  Stop when you are tired.  Ideally, walk several times a day, eventually an hour a day.   Most people are back to most day-to-day activities in a few weeks.  It takes 4-6 weeks to get back to unrestricted, intense activity. If you can walk 30 minutes without difficulty, it is safe to try more intense activity such as jogging, treadmill, bicycling, low-impact aerobics, swimming, etc. Save the most intensive and strenuous activity for last (Usually 4-8 weeks after surgery) such as sit-ups, heavy lifting, contact sports, etc.  Refrain from any intense heavy lifting or straining until you are off narcotics for pain control.  You will have off days, but  things should improve week-by-week. DO NOT PUSH THROUGH PAIN.  Let pain be your guide: If it hurts to do something, don't do it.   Lifting restrictions   Complete by: As directed    If you can walk 30 minutes without difficulty, it is safe to try more intense activity such as jogging, treadmill, bicycling, low-impact aerobics, swimming, etc. Save the most intensive and strenuous activity for last (Usually 4-8 weeks after surgery) such as sit-ups, heavy lifting, contact sports, etc.   Refrain from any intense heavy lifting or straining until you are off narcotics for pain control.  You will have off days, but things should improve week-by-week. DO NOT PUSH THROUGH PAIN.  Let pain be your guide: If it hurts to do something, don't do it.  Pain is your body warning you to avoid that activity for another week until the pain goes down.   May shower / Bathe   Complete by: As directed    May walk up steps   Complete by: As directed    Remove dressing in 72 hours   Complete by: As directed    Make sure all dressings are removed by the third day after surgery.  Leave incisions open to air.  OK to cover incisions with gauze or  bandages as desired   Sexual Activity Restrictions   Complete by: As directed    You may have sexual intercourse when it is comfortable. If it hurts to do something, stop.      Allergies as of 02/19/2020      Reactions   Nsaids    Avoid NSAIDS related to nephrectomy.      Medication List    TAKE these medications   amLODipine 5 MG tablet Commonly known as: NORVASC Take 5 mg by mouth daily.   atorvastatin 40 MG tablet Commonly known as: LIPITOR Take 40 mg by mouth daily.   lisinopril-hydrochlorothiazide 20-25 MG tablet Commonly known as: ZESTORETIC Take 1 tablet by mouth daily.   methocarbamol 750 MG tablet Commonly known as: ROBAXIN Take 1 tablet (750 mg total) by mouth 4 (four) times daily as needed (use for muscle cramps/pain).   oxyCODONE 5 MG immediate release tablet Commonly known as: Oxy IR/ROXICODONE Take 1-2 tablets (5-10 mg total) by mouth every 6 (six) hours as needed for moderate pain, severe pain or breakthrough pain.            Discharge Care Instructions  (From admission, onward)         Start     Ordered   02/18/20 0000  Discharge wound care:       Comments: It is good for closed incisions and even open wounds to be washed every day.  Shower every day.  Short baths are fine.  Wash the incisions and wounds clean with soap & water.    You may leave closed incisions open to air if it is dry.   You may cover the incision with clean gauze & replace it after your daily shower for comfort.  STERISTRIPS:  You have skin tapes called Steristrips on your incisions.  Leave them in place, and they will fall off on their own like a scab in 1-3 weeks.  You may trim any edges that curl up with clean scissors.  You may remove them in the shower in a week.  TEGADERM:  You have clear gauze band-aid dressings over your closed incision(s).  Remove the dressings 3 days after surgery.   02/18/20 1640  Significant Diagnostic Studies:  Results for orders  placed or performed during the hospital encounter of 02/18/20 (from the past 72 hour(s))  CBC     Status: Abnormal   Collection Time: 02/18/20  8:02 PM  Result Value Ref Range   WBC 11.7 (H) 4.0 - 10.5 K/uL   RBC 4.32 3.87 - 5.11 MIL/uL   Hemoglobin 13.4 12.0 - 15.0 g/dL   HCT 43.0 36.0 - 46.0 %   MCV 99.5 80.0 - 100.0 fL   MCH 31.0 26.0 - 34.0 pg   MCHC 31.2 30.0 - 36.0 g/dL   RDW 12.8 11.5 - 15.5 %   Platelets 240 150 - 400 K/uL   nRBC 0.0 0.0 - 0.2 %    Comment: Performed at [facility], [place] [address], [place], [place] [address]  Creatinine, serum     Status: Abnormal   Collection Time: 02/18/20  8:02 PM  Result Value Ref Range   Creatinine, Ser 1.13 (H) 0.44 - 1.00 mg/dL   GFR, Estimated 54 (L) >60 mL/min    Comment: (NOTE) Calculated using the CKD-EPI Creatinine Equation (2021) Performed at [facility], [place] [address] [place], [place] [address]   Basic metabolic panel     Status: Abnormal   Collection Time: 02/19/20  5:35 AM  Result Value Ref Range   Sodium 136 135 - 145 mmol/L   Potassium 4.4 3.5 - 5.1 mmol/L   Chloride 98 98 - 111 mmol/L   CO2 28 22 - 32 mmol/L   Glucose, Bld 130 (H) 70 - 99 mg/dL    Comment: Glucose reference range applies only to samples taken after fasting for at least 8 hours.   BUN 12 8 - 23 mg/dL   Creatinine, Ser 0.99 0.44 - 1.00 mg/dL   Calcium 8.8 (L) 8.9 - 10.3 mg/dL   GFR, Estimated >60 >60 mL/min    Comment: (NOTE) Calculated using the CKD-EPI Creatinine Equation (2021)    Anion gap 10 5 - 15    Comment: Performed at [facility], [place] [address], [place], [place] [address]  Magnesium     Status: None   Collection Time: 02/19/20  5:35 AM  Result Value Ref Range   Magnesium 2.0 1.7 - 2.4 mg/dL    Comment: Performed at [facility], [place] [address] [place], [place] [address]  CBC     Status: None   Collection Time: 02/19/20  5:35 AM  Result Value Ref Range    WBC 9.5 4.0 - 10.5 K/uL   RBC 4.05 3.87 - 5.11 MIL/uL   Hemoglobin 12.5 12.0 - 15.0 g/dL   HCT 39.1 36.0 - 46.0 %   MCV 96.5 80.0 - 100.0 fL   MCH 30.9 26.0 - 34.0 pg   MCHC 32.0 30.0 - 36.0 g/dL   RDW 12.7 11.5 - 15.5 %   Platelets 255 150 - 400 K/uL   nRBC 0.0 0.0 - 0.2 %    Comment: Performed at [facility], [place] [address], [place], [place] [address]    No results found.  Past Medical History:  Diagnosis Date  . Asthma   . Cancer ([place])    cervix  . 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 Hypertension   . Pre-diabetes     Past Surgical History:  Procedure Laterality Date  . ABDOMINAL HYSTERECTOMY    . TOTAL NEPHRECTOMY Right     Social  History   Socioeconomic History  . Marital status: Single    Spouse name: Not on file  . Number of children: Not on file  . Years of education: Not on file  . Highest education level: Not on file  Occupational History  . Not on file  Tobacco Use  . Smoking status: Former Smoker    Types: Cigarettes  . Smokeless tobacco: Never Used  . Tobacco comment: stop seven years ago  Vaping Use  . Vaping Use: Never used  Substance and Sexual Activity  . Alcohol use: Never  . Drug use: Never  . Sexual activity: Not on file  Other Topics Concern  . Not on file  Social History Narrative  . Not on file   Social Determinants of Health   Financial Resource Strain: Not on file  Food Insecurity: Not on file  Transportation Needs: Not on file  Physical Activity: Not on file  Stress: Not on file  Social Connections: Not on file  Intimate Partner Violence: Not on file    History reviewed. No pertinent family history.  Current Facility-Administered Medications  Medication Dose Route Frequency Provider Last Rate Last Admin  . 0.9 %  sodium chloride infusion  250 mL Intravenous PRN Michael Boston, MD      . acetaminophen (TYLENOL) tablet 1,000 mg  1,000 mg Oral Lajuana Ripple, MD    1,000 mg at 02/19/20 0538  . albuterol (PROVENTIL) (2.5 MG/3ML) 0.083% nebulizer solution 2.5 mg  2.5 mg Nebulization Q6H PRN Michael Boston, MD      . amLODipine (NORVASC) tablet 5 mg  5 mg Oral Daily Michael Boston, MD      . atorvastatin (LIPITOR) tablet 40 mg  40 mg Oral Daily Michael Boston, MD      . bisacodyl (DULCOLAX) suppository 10 mg  10 mg Rectal Daily PRN Michael Boston, MD      . Chlorhexidine Gluconate Cloth 2 % PADS 6 each  6 each Topical Once Michael Boston, MD      . diphenhydrAMINE (BENADRYL) 12.5 MG/5ML elixir 12.5 mg  12.5 mg Oral Q6H PRN Michael Boston, MD       Or  . diphenhydrAMINE (BENADRYL) injection 12.5 mg  12.5 mg Intravenous Q6H PRN Michael Boston, MD      . enoxaparin (LOVENOX) injection 40 mg  40 mg Subcutaneous Q24H Michael Boston, MD      . gabapentin (NEURONTIN) capsule 300 mg  300 mg Oral BID Michael Boston, MD   300 mg at 02/18/20 2011  . lisinopril (ZESTRIL) tablet 20 mg  20 mg Oral Daily Michael Boston, MD   20 mg at 02/18/20 2016   And  . hydrochlorothiazide (HYDRODIURIL) tablet 25 mg  25 mg Oral Daily Michael Boston, MD   25 mg at 02/18/20 2011  . lactated ringers bolus 1,000 mL  1,000 mL Intravenous Q8H PRN Michael Boston, MD      . lactated ringers infusion   Intravenous Continuous Michael Boston, MD   Stopped at 02/19/20 [phone]  . lip balm (CARMEX) ointment 1 application  1 application Topical BID Michael Boston, MD   1 application at [phone]  . magic mouthwash  15 mL Oral QID PRN Michael Boston, MD      . magnesium hydroxide (MILK OF MAGNESIA) suspension 30 mL  30 mL Oral Daily PRN Michael Boston, MD      . methocarbamol (ROBAXIN) 1,000 mg in dextrose 5 % 100 mL IVPB  1,000 mg Intravenous Q6H PRN  Michael Boston, MD      . methocarbamol (ROBAXIN) tablet 1,000 mg  1,000 mg Oral Q6H PRN Michael Boston, MD   1,000 mg at 02/18/20 2011  . metoprolol tartrate (LOPRESSOR) injection 5 mg  5 mg Intravenous Q6H PRN Michael Boston, MD      . oxyCODONE (Oxy IR/ROXICODONE)  immediate release tablet 5-10 mg  5-10 mg Oral Q4H PRN Michael Boston, MD   10 mg at 02/18/20 2009  . psyllium (HYDROCIL/METAMUCIL) 1 packet  1 packet Oral BID Michael Boston, MD   1 packet at 02/18/20 2012  . simethicone (MYLICON) chewable tablet 40 mg  40 mg Oral Q6H PRN Michael Boston, MD   40 mg at 02/19/20 0145  . sodium chloride flush (NS) 0.9 % injection 3 mL  3 mL Intravenous Gorden Harms, MD      . sodium chloride flush (NS) 0.9 % injection 3 mL  3 mL Intravenous PRN Michael Boston, MD      . traMADol Veatrice Bourbon) tablet 50 mg  50 mg Oral Q6H PRN Michael Boston, MD   50 mg at 02/19/20 0145     Allergies  Allergen Reactions  . Nsaids     Avoid NSAIDS related to nephrectomy.    Signed: Morton Peters, MD, FACS, MASCRS Gastrointestinal and Minimally Invasive Surgery  [facility] [address], [place], [place] [address] [phone] Fax [phone] Main/Paging  CONTACT INFORMATION: Weekday (9AM-5PM) concerns: Call [facility] main office at [phone] Weeknight (5PM-9AM) or Weekend/Holiday concerns: Check [email or web address] for General Surgery [facility] coverage (Please, do not use SecureChat as it is not reliable communication to operating surgeons for immediate patient care)      02/19/2020, 9:20 AM

## 2020-02-20 DIAGNOSIS — N179 Acute kidney failure, unspecified: Secondary | ICD-10-CM | POA: Diagnosis not present

## 2020-02-20 DIAGNOSIS — G4733 Obstructive sleep apnea (adult) (pediatric): Secondary | ICD-10-CM

## 2020-02-20 DIAGNOSIS — D62 Acute posthemorrhagic anemia: Secondary | ICD-10-CM | POA: Diagnosis not present

## 2020-02-20 DIAGNOSIS — Z6841 Body Mass Index (BMI) 40.0 and over, adult: Secondary | ICD-10-CM | POA: Diagnosis not present

## 2020-02-20 DIAGNOSIS — Z20822 Contact with and (suspected) exposure to covid-19: Secondary | ICD-10-CM | POA: Diagnosis present

## 2020-02-20 DIAGNOSIS — R739 Hyperglycemia, unspecified: Secondary | ICD-10-CM | POA: Diagnosis present

## 2020-02-20 DIAGNOSIS — E785 Hyperlipidemia, unspecified: Secondary | ICD-10-CM | POA: Diagnosis present

## 2020-02-20 DIAGNOSIS — F419 Anxiety disorder, unspecified: Secondary | ICD-10-CM | POA: Diagnosis present

## 2020-02-20 DIAGNOSIS — J9601 Acute respiratory failure with hypoxia: Secondary | ICD-10-CM | POA: Diagnosis not present

## 2020-02-20 DIAGNOSIS — I10 Essential (primary) hypertension: Secondary | ICD-10-CM

## 2020-02-20 DIAGNOSIS — K432 Incisional hernia without obstruction or gangrene: Secondary | ICD-10-CM | POA: Diagnosis not present

## 2020-02-20 DIAGNOSIS — R7303 Prediabetes: Secondary | ICD-10-CM | POA: Diagnosis present

## 2020-02-20 DIAGNOSIS — J45909 Unspecified asthma, uncomplicated: Secondary | ICD-10-CM | POA: Diagnosis present

## 2020-02-20 DIAGNOSIS — R7989 Other specified abnormal findings of blood chemistry: Secondary | ICD-10-CM | POA: Diagnosis not present

## 2020-02-20 DIAGNOSIS — M25551 Pain in right hip: Secondary | ICD-10-CM | POA: Diagnosis present

## 2020-02-20 DIAGNOSIS — J9602 Acute respiratory failure with hypercapnia: Secondary | ICD-10-CM | POA: Diagnosis not present

## 2020-02-20 DIAGNOSIS — N183 Chronic kidney disease, stage 3 unspecified: Secondary | ICD-10-CM | POA: Diagnosis present

## 2020-02-20 DIAGNOSIS — I129 Hypertensive chronic kidney disease with stage 1 through stage 4 chronic kidney disease, or unspecified chronic kidney disease: Secondary | ICD-10-CM | POA: Diagnosis present

## 2020-02-20 DIAGNOSIS — K760 Fatty (change of) liver, not elsewhere classified: Secondary | ICD-10-CM | POA: Diagnosis present

## 2020-02-20 DIAGNOSIS — E662 Morbid (severe) obesity with alveolar hypoventilation: Secondary | ICD-10-CM | POA: Diagnosis present

## 2020-02-20 DIAGNOSIS — R109 Unspecified abdominal pain: Secondary | ICD-10-CM | POA: Diagnosis present

## 2020-02-20 DIAGNOSIS — T402X1A Poisoning by other opioids, accidental (unintentional), initial encounter: Secondary | ICD-10-CM | POA: Diagnosis not present

## 2020-02-20 DIAGNOSIS — K5900 Constipation, unspecified: Secondary | ICD-10-CM | POA: Diagnosis present

## 2020-02-20 DIAGNOSIS — R946 Abnormal results of thyroid function studies: Secondary | ICD-10-CM | POA: Diagnosis present

## 2020-02-20 DIAGNOSIS — Z79899 Other long term (current) drug therapy: Secondary | ICD-10-CM | POA: Diagnosis not present

## 2020-02-20 DIAGNOSIS — Z905 Acquired absence of kidney: Secondary | ICD-10-CM | POA: Diagnosis not present

## 2020-02-20 DIAGNOSIS — J9600 Acute respiratory failure, unspecified whether with hypoxia or hypercapnia: Secondary | ICD-10-CM | POA: Diagnosis present

## 2020-02-20 DIAGNOSIS — Z602 Problems related to living alone: Secondary | ICD-10-CM | POA: Diagnosis present

## 2020-02-20 LAB — COMPREHENSIVE METABOLIC PANEL
ALT: 23 U/L (ref 0–44)
AST: 32 U/L (ref 15–41)
Albumin: 3.6 g/dL (ref 3.5–5.0)
Alkaline Phosphatase: 64 U/L (ref 38–126)
Anion gap: 9 (ref 5–15)
BUN: 20 mg/dL (ref 8–23)
CO2: 29 mmol/L (ref 22–32)
Calcium: 8.7 mg/dL — ABNORMAL LOW (ref 8.9–10.3)
Chloride: 99 mmol/L (ref 98–111)
Creatinine, Ser: 1.3 mg/dL — ABNORMAL HIGH (ref 0.44–1.00)
GFR, Estimated: 46 mL/min — ABNORMAL LOW (ref >60)
Glucose, Bld: 127 mg/dL — ABNORMAL HIGH (ref 70–99)
Potassium: 3.9 mmol/L (ref 3.5–5.1)
Sodium: 137 mmol/L (ref 135–145)
Total Bilirubin: 0.5 mg/dL (ref 0.3–1.2)
Total Protein: 6.6 g/dL (ref 6.5–8.1)

## 2020-02-20 LAB — RAPID URINE DRUG SCREEN, HOSP PERFORMED
Amphetamines: NOT DETECTED
Barbiturates: NOT DETECTED
Benzodiazepines: POSITIVE — AB
Cocaine: NOT DETECTED
Opiates: NOT DETECTED
Tetrahydrocannabinol: NOT DETECTED

## 2020-02-20 LAB — CBC WITH DIFFERENTIAL/PLATELET
Abs Immature Granulocytes: 0.02 10*3/uL (ref 0.00–0.07)
Basophils Absolute: 0 10*3/uL (ref 0.0–0.1)
Basophils Relative: 0 %
Eosinophils Absolute: 0 10*3/uL (ref 0.0–0.5)
Eosinophils Relative: 0 %
HCT: 39 % (ref 36.0–46.0)
Hemoglobin: 11.9 g/dL — ABNORMAL LOW (ref 12.0–15.0)
Immature Granulocytes: 0 %
Lymphocytes Relative: 23 %
Lymphs Abs: 2.2 10*3/uL (ref 0.7–4.0)
MCH: 30.8 pg (ref 26.0–34.0)
MCHC: 30.5 g/dL (ref 30.0–36.0)
MCV: 101 fL — ABNORMAL HIGH (ref 80.0–100.0)
Monocytes Absolute: 1 10*3/uL (ref 0.1–1.0)
Monocytes Relative: 11 %
Neutro Abs: 6.2 10*3/uL (ref 1.7–7.7)
Neutrophils Relative %: 66 %
Platelets: 216 10*3/uL (ref 150–400)
RBC: 3.86 MIL/uL — ABNORMAL LOW (ref 3.87–5.11)
RDW: 12.9 % (ref 11.5–15.5)
WBC: 9.5 10*3/uL (ref 4.0–10.5)
nRBC: 0 % (ref 0.0–0.2)

## 2020-02-20 LAB — URINALYSIS, ROUTINE W REFLEX MICROSCOPIC
Bilirubin Urine: NEGATIVE
Glucose, UA: NEGATIVE mg/dL
Hgb urine dipstick: NEGATIVE
Ketones, ur: NEGATIVE mg/dL
Leukocytes,Ua: NEGATIVE
Nitrite: NEGATIVE
Protein, ur: NEGATIVE mg/dL
Specific Gravity, Urine: 1.013 (ref 1.005–1.030)
pH: 5 (ref 5.0–8.0)

## 2020-02-20 LAB — MAGNESIUM: Magnesium: 2.3 mg/dL (ref 1.7–2.4)

## 2020-02-20 LAB — TSH: TSH: 7.856 u[IU]/mL — ABNORMAL HIGH (ref 0.350–4.500)

## 2020-02-20 LAB — HIV ANTIBODY (ROUTINE TESTING W REFLEX): HIV Screen 4th Generation wRfx: NONREACTIVE

## 2020-02-20 LAB — PHOSPHORUS: Phosphorus: 3.1 mg/dL (ref 2.5–4.6)

## 2020-02-20 LAB — MRSA PCR SCREENING: MRSA by PCR: NEGATIVE

## 2020-02-20 LAB — TROPONIN I (HIGH SENSITIVITY): Troponin I (High Sensitivity): 12 ng/L (ref <18)

## 2020-02-20 LAB — T4, FREE: Free T4: 0.68 ng/dL (ref 0.61–1.12)

## 2020-02-20 MED ORDER — SODIUM CHLORIDE 0.9 % IV SOLN
75.0000 mL/h | INTRAVENOUS | Status: DC
  Administered 2020-02-21: 75 mL/h via INTRAVENOUS

## 2020-02-20 MED ORDER — MAGIC MOUTHWASH
15.0000 mL | Freq: Four times a day (QID) | ORAL | Status: DC | PRN
  Filled 2020-02-20: qty 15

## 2020-02-20 MED ORDER — ACETAMINOPHEN 325 MG PO TABS
650.0000 mg | ORAL_TABLET | Freq: Four times a day (QID) | ORAL | Status: DC | PRN
  Administered 2020-02-20: 650 mg via ORAL
  Filled 2020-02-20: qty 2

## 2020-02-20 MED ORDER — CHLORHEXIDINE GLUCONATE CLOTH 2 % EX PADS
6.0000 | MEDICATED_PAD | Freq: Every day | CUTANEOUS | Status: DC
  Administered 2020-02-20 – 2020-02-22 (×2): 6 via TOPICAL

## 2020-02-20 MED ORDER — POLYETHYLENE GLYCOL 3350 17 G PO PACK: 17.0000 g | PACK | Freq: Every day | ORAL | Status: DC

## 2020-02-20 MED ORDER — PHENOL 1.4 % MT LIQD
2.0000 | OROMUCOSAL | Status: DC | PRN
  Filled 2020-02-20: qty 177

## 2020-02-20 MED ORDER — POLYETHYLENE GLYCOL 3350 17 G PO PACK
34.0000 g | PACK | Freq: Once | ORAL | Status: AC
  Administered 2020-02-20: 34 g via ORAL
  Filled 2020-02-20: qty 2

## 2020-02-20 MED ORDER — ACETAMINOPHEN 500 MG PO TABS
500.0000 mg | ORAL_TABLET | Freq: Three times a day (TID) | ORAL | Status: DC
  Administered 2020-02-20 – 2020-02-22 (×7): 500 mg via ORAL
  Filled 2020-02-20 (×9): qty 1

## 2020-02-20 MED ORDER — ENOXAPARIN SODIUM 60 MG/0.6ML ~~LOC~~ SOLN
50.0000 mg | SUBCUTANEOUS | Status: DC
  Administered 2020-02-20 – 2020-02-22 (×3): 50 mg via SUBCUTANEOUS
  Filled 2020-02-20: qty 0.6
  Filled 2020-02-20: qty 0.5
  Filled 2020-02-20: qty 0.6

## 2020-02-20 MED ORDER — BISACODYL 10 MG RE SUPP: 10.0000 mg | Freq: Two times a day (BID) | RECTAL | Status: DC | PRN

## 2020-02-20 MED ORDER — SENNA 8.6 MG PO TABS: 1.0000 | ORAL_TABLET | Freq: Every evening | ORAL | Status: DC | PRN

## 2020-02-20 MED ORDER — MENTHOL 3 MG MT LOZG
1.0000 | LOZENGE | OROMUCOSAL | Status: DC | PRN
  Filled 2020-02-20: qty 9

## 2020-02-20 MED ORDER — METHOCARBAMOL 1000 MG/10ML IJ SOLN
1000.0000 mg | Freq: Four times a day (QID) | INTRAVENOUS | Status: DC | PRN
  Filled 2020-02-20: qty 10

## 2020-02-20 MED ORDER — LIP MEDEX EX OINT
1.0000 "application " | TOPICAL_OINTMENT | Freq: Two times a day (BID) | CUTANEOUS | Status: DC
  Administered 2020-02-20 – 2020-02-22 (×5): 1 via TOPICAL
  Filled 2020-02-20 (×2): qty 7

## 2020-02-20 MED ORDER — ACETAMINOPHEN 650 MG RE SUPP: 650.0000 mg | Freq: Four times a day (QID) | RECTAL | Status: DC | PRN

## 2020-02-20 MED ORDER — HYDROCODONE-ACETAMINOPHEN 5-325 MG PO TABS
1.0000 | ORAL_TABLET | ORAL | Status: DC | PRN
Start: 2020-02-20 — End: 2020-02-22
  Administered 2020-02-20 – 2020-02-22 (×7): 1 via ORAL
  Filled 2020-02-20 (×8): qty 1

## 2020-02-20 MED ORDER — ACETAMINOPHEN 500 MG PO TABS: 1000.0000 mg | ORAL_TABLET | Freq: Four times a day (QID) | ORAL | Status: DC

## 2020-02-20 MED ORDER — SIMETHICONE 80 MG PO CHEW
80.0000 mg | CHEWABLE_TABLET | Freq: Four times a day (QID) | ORAL | Status: DC | PRN
  Administered 2020-02-20: 80 mg via ORAL
  Filled 2020-02-20: qty 1

## 2020-02-20 MED ORDER — SODIUM CHLORIDE 0.9 % IV SOLN
75.0000 mL/h | INTRAVENOUS | Status: AC
  Administered 2020-02-20: 75 mL/h via INTRAVENOUS

## 2020-02-20 MED ORDER — ALBUTEROL SULFATE (2.5 MG/3ML) 0.083% IN NEBU
2.5000 mg | INHALATION_SOLUTION | RESPIRATORY_TRACT | Status: DC | PRN
Start: 2020-02-20 — End: 2020-02-22
  Administered 2020-02-21: 2.5 mg via RESPIRATORY_TRACT
  Filled 2020-02-20: qty 3

## 2020-02-20 MED ORDER — ALUM & MAG HYDROXIDE-SIMETH 200-200-20 MG/5ML PO SUSP
30.0000 mL | Freq: Four times a day (QID) | ORAL | Status: DC | PRN
  Administered 2020-02-20: 30 mL via ORAL
  Filled 2020-02-20 (×2): qty 30

## 2020-02-20 MED ORDER — POLYETHYLENE GLYCOL 3350 17 G PO PACK
17.0000 g | PACK | Freq: Two times a day (BID) | ORAL | Status: DC
  Administered 2020-02-20 – 2020-02-21 (×3): 17 g via ORAL
  Filled 2020-02-20 (×4): qty 1

## 2020-02-20 MED ORDER — AMLODIPINE BESYLATE 5 MG PO TABS
5.0000 mg | ORAL_TABLET | Freq: Every day | ORAL | Status: DC
  Administered 2020-02-21 – 2020-02-22 (×2): 5 mg via ORAL
  Filled 2020-02-20 (×2): qty 1

## 2020-02-20 NOTE — Evaluation (Signed)
Occupational Therapy Evaluation Patient Details Name: Jody Fitzgerald MRN: [id number] DOB: 09-Aug-1955 Today's Date: 02/20/2020    History of Present Illness Jody Fitzgerald is a 65 y.o. female with medical history significant of obesity,  asthma, CKD stage III status post nephrectomy, HTN, prediabetes.Underwent elective repair of incisional flank abdominal wall hernia 02/18/20, no documented complications.  On 1/20, patient was discharged.  After discharge while awaiting ride, patient became suddenly unresponsive, hypoxic. Rushed to ER by rapid response. Narcan given with improved arousal.   Clinical Impression   Patient presents with decreased activity tolerance, impaired balance and pain s/p hernia repair surgery. Patient needed significant assistance to transfer out of bed, min guard and use of RW to ambulate to bathroom. Patient able to perform toilet transfer and manage clothing but unable to perform wiping due to abdominal discomfort. Patient unable to reach feet to perform LB ADLs. Patient desatted to 72% on RA after ambulating to bathroom and toileting. Recovered with cues for breathing through nose to 89%. Placed on 2L for rest of evaluation with o2 sat 93%. Patient will benefit from skilled OT services while in hospital to improve deficits and learn compensatory strategies as needed in order to return to PLOF. Discussed that patient is not safe to discharge home alone without any assistance and therapist recommends SNF at this time.     Follow Up Recommendations  Home health OT;SNF    Equipment Recommendations  Tub/shower seat    Recommendations for Other Services       Precautions / Restrictions Precautions Precautions: Fall Precaution Comments: monitor sats      Mobility Bed Mobility Overal bed mobility: Needs Assistance Bed Mobility: Supine to Sit;Rolling;Sidelying to Sit     Supine to sit: Mod assist     General bed mobility comments: patient initiated but struggling so  assisted with legs and trunk,    Transfers Overall transfer level: Needs assistance Equipment used: Rolling walker (2 wheeled) Transfers: Sit to/from Stand Sit to Stand: Min guard         General transfer comment: extra time to rise from bed and toilet. Guarded abdomen.    Balance Overall balance assessment: Needs assistance Sitting-balance support: No upper extremity supported Sitting balance-Leahy Scale: Fair     Standing balance support: During functional activity;Single extremity supported Standing balance-Leahy Scale: Fair Standing balance comment: for toileting                           ADL either performed or assessed with clinical judgement   ADL Overall ADL's : Needs assistance/impaired Eating/Feeding: Independent   Grooming: Independent;Standing Grooming Details (indicate cue type and reason): standing at sink Upper Body Bathing: Set up   Lower Body Bathing: Moderate assistance;Sit to/from stand   Upper Body Dressing : Set up   Lower Body Dressing: Moderate assistance;Sit to/from stand   Toilet Transfer: Tour manager Toilet;RW   Toileting- Water quality scientist and Hygiene: Moderate assistance;Sit to/from stand Toileting - Clothing Manipulation Details (indicate cue type and reason): assistance for wiping needed - patient unable to reach             Vision Patient Visual Report: No change from baseline       Perception     Praxis      Pertinent Vitals/Pain Pain Assessment: Faces Pain Score: 3  Faces Pain Scale: Hurts a little bit Pain Location: abdomen Pain Descriptors / Indicators: Discomfort;Guarding Pain Intervention(s): Limited activity within patient's tolerance;Monitored during session  Hand Dominance     Extremity/Trunk Assessment Upper Extremity Assessment Upper Extremity Assessment: Overall WFL for tasks assessed   Lower Extremity Assessment Lower Extremity Assessment: Defer to PT evaluation   Cervical /  Trunk Assessment Cervical / Trunk Assessment: Normal   Communication Communication Communication: Prefers language other than English   Cognition Arousal/Alertness: Awake/alert Behavior During Therapy: WFL for tasks assessed/performed Overall Cognitive Status: Within Functional Limits for tasks assessed                                 General Comments: interpreter used.   General Comments       Exercises     Shoulder Instructions      Home Living Family/patient expects to be discharged to:: Private residence Living Arrangements: Alone Available Help at Discharge: Family Type of Home: Apartment Home Access: Elevator     Home Layout: One level               Home Equipment: None   Additional Comments: no family available to assist patient      Prior Functioning/Environment Level of Independence: Independent                 OT Problem List: Decreased activity tolerance;Impaired balance (sitting and/or standing);Pain;Decreased knowledge of use of DME or AE;Obesity      OT Treatment/Interventions: Self-care/ADL training;DME and/or AE instruction;Patient/family education;Balance training;Therapeutic activities    OT Goals(Current goals can be found in the care plan section) Acute Rehab OT Goals Patient Stated Goal: to go home OT Goal Formulation: With patient Time For Goal Achievement: 03/05/20 Potential to Achieve Goals: Good  OT Frequency: Min 2X/week   Barriers to D/C:            Co-evaluation PT/OT/SLP Co-Evaluation/Treatment: Yes Reason for Co-Treatment: For patient/therapist safety;To address functional/ADL transfers PT goals addressed during session: Mobility/safety with mobility OT goals addressed during session: ADL's and self-care      AM-PAC OT "6 Clicks" Daily Activity     Outcome Measure Help from another person eating meals?: None Help from another person taking care of personal grooming?: A Little Help from another  person toileting, which includes using toliet, bedpan, or urinal?: A Little Help from another person bathing (including washing, rinsing, drying)?: A Lot Help from another person to put on and taking off regular upper body clothing?: A Little Help from another person to put on and taking off regular lower body clothing?: A Lot 6 Click Score: 17   End of Session Equipment Utilized During Treatment: Rolling walker Nurse Communication: Mobility status  Activity Tolerance: Patient tolerated treatment well Patient left: in chair  OT Visit Diagnosis: Unsteadiness on feet (R26.81);Pain                Time: 0160-1093 OT Time Calculation (min): 28 min Charges:  OT General Charges $OT Visit: 1 Visit OT Evaluation $OT Eval Moderate Complexity: 1 Mod  Cici Rodriges, OTR/L [place]  Office [phone] Pager: [place] 02/20/2020, 4:12 PM

## 2020-02-20 NOTE — Progress Notes (Signed)
Pt on O2 2L [place], satting @ 97%.

## 2020-02-20 NOTE — Progress Notes (Addendum)
Progress Note    Jody Fitzgerald  XNA:[id number] DOB: 20-Jul-1955  DOA: 02/19/2020 PCP: Patient, No Pcp Per    Brief Narrative:   Chief complaint: Unresponsive  Medical records reviewed and are as summarized below:  Jody Fitzgerald is an 65 y.o. female with a PMH of obesity, recent hernia repair 02/18/20, asthma, CKD stage III status post nephrectomy, HTN, prediabetes who presented to the ED 02/19/20 shortly after discharge (was waiting for ride home in the lobby) after being found unresponsive, pale and diaphoretic with pulse oximetry readings of 30%. Became responsive after being treated with narcan.   Assessment/Plan:   Principal Problem:   Acute respiratory failure with hypoxia and hypercapnia (HCC) secondary to opioid induced respiratory depression s/p hernia repair in the setting of OSA/OHS Acute respiratory failure with hypoxia and hypercarbia as documented by the presence of following: O2 saturation< 90% on RA  pH <7.35 with pCO2 >50. Responded to [facility].   Active Problems:   Rule out Hypothyroidism TSH 7.856, Free T4 and T 3 both WNL. F/U for repeat TSH in 4 weeks as outpatient.    Acute blood loss anemia Hgb 14.3-->11.9 post-operatively.  ? Dilutional effects.  Monitor trend.     Acute kidney injury/history of right nephrectomy Patient's baseline creatinine is 0.99, with acute rise > 0.3, currently 1.30 (1.40 yesterday). Continue gentle hydration.    Hypertension Resume Norvasc. Hold Zestoretic for now.    Pre-diabetes/hyperglycemia Blood glucoses as high as 200 on chemistries, unclear if this was a fasting sample. Hgb A1c 5.5%.  Needs close outpatient follow up.    Incisional hernia of right flank s/p lap repair w mesh 02/18/2020 Dr. Johney Maine following. Does not feel she is stable for discharge per his notes.    Obesity (BMI 30-39.9)  Body mass index is 40.71 kg/m. Needs outpatient f/u with a dietician for counseling.   Family Communication/Anticipated D/C date and  plan/Code Status   DVT prophylaxis: Lovenox. Current Level of Care:: Level of care: Stepdown Code Status: Full Code.  Family Communication:  Disposition Plan: Status is: Observation (changing to inpatient)  The patient will require care spanning > 2 midnights and should be moved to inpatient because: Acute respiratory failure at high risk for decompensation, surgery has not yet cleared for d/c.  Significant drop in hgb noted, and has underlying OHS/OSA making treatment of her pain risky.  Dispo: The patient is from: Home              Anticipated d/c is to: Home              Anticipated d/c date is: 2 days              Patient currently is not medically stable to d/c.  Medical Consultants:    Surgery   Anti-Infectives:    None  Subjective:   Doesn't recall the events of yesterday.  Not currently short of breath.  No cough.  Reports gas and incisional pain.   Objective:    Vitals:   02/20/20 0300 02/20/20 0400 02/20/20 0500 02/20/20 0613  BP: 137/74 (!) 156/81 (!) 130/59 (!) 122/59  Pulse: 68 75 70 85  Resp: 12 14 15 19   Temp: 99.1 F (37.3 C) 99.1 F (37.3 C) 99.1 F (37.3 C) 98.6 F (37 C)  TempSrc:  Oral Oral Oral  SpO2: 98% 97% 100% 97%  Weight:      Height:        Intake/Output Summary (Last 24 hours)  at 02/20/2020 0739 Last data filed at 02/20/2020 0500 Gross per 24 hour  Intake 480 ml  Output 950 ml  Net -470 ml   Filed Weights   02/20/20 0000  Weight: 114.4 kg    Exam: General: No acute distress. Obese. Cardiovascular: Heart sounds show a regular rate, and rhythm. No gallops or rubs. No murmurs. No JVD. Lungs: Clear to auscultation bilaterally with diminished breath sounds. No rales, rhonchi or wheezes. Abdomen: Soft, nontender, nondistended with normal active bowel sounds. No masses. No hepatosplenomegaly. Incisional pain. Neurological: Alert and oriented 3. Moves all extremities 4 with equal strength, weak. Cranial nerves II through XII  grossly intact. Skin: Warm and dry. No rashes or lesions. Extremities: No clubbing or cyanosis. No edema. Pedal pulses 2+. Psychiatric: Mood and affect are flat. Insight and judgment are fair.  Data Reviewed:   I have personally reviewed following labs and imaging studies:  Labs: Labs show the following:   Basic Metabolic Panel: Recent Labs  Lab 02/18/20 2002 02/19/20 0535 02/19/20 0535 02/19/20 1659 02/19/20 1705 02/20/20 0033  NA  --  136  --  134* 134* 137  K  --  4.4   < > 4.0 4.0 3.9  CL  --  98  --  95* 96* 99  CO2  --  28  --  29  --  29  GLUCOSE  --  130*  --  200* 190* 127*  BUN  --  12  --  21 21 20   CREATININE 1.13* 0.99  --  1.35* 1.40* 1.30*  CALCIUM  --  8.8*  --  9.0  --  8.7*  MG  --  2.0  --  2.3  --  2.3  PHOS  --   --   --   --   --  3.1   < > = values in this interval not displayed.   GFR Estimated Creatinine Clearance: 56.1 mL/min (A) (by C-G formula based on SCr of 1.3 mg/dL (H)). Liver Function Tests: Recent Labs  Lab 02/19/20 1659 02/20/20 0033  AST 37 32  ALT 29 23  ALKPHOS 67 64  BILITOT 0.6 0.5  PROT 7.2 6.6  ALBUMIN 3.9 3.6   CBC: Recent Labs  Lab 02/18/20 2002 02/19/20 0535 02/19/20 1659 02/19/20 1705 02/20/20 0033  WBC 11.7* 9.5 12.6*  --  9.5  NEUTROABS  --   --  8.0*  --  6.2  HGB 13.4 12.5 12.8 14.3 11.9*  HCT 43.0 39.1 41.8 42.0 39.0  MCV 99.5 96.5 101.2*  --  101.0*  PLT 240 255 295  --  216   CBG: Recent Labs  Lab 02/19/20 1651  GLUCAP 176*   Thyroid function studies: Recent Labs    02/20/20 0033  TSH 7.856*    Microbiology Recent Results (from the past 240 hour(s))  SARS CORONAVIRUS 2 (TAT 6-24 HRS) Nasopharyngeal Nasopharyngeal Swab     Status: None   Collection Time: 02/14/20  1:55 PM   Specimen: Nasopharyngeal Swab  Result Value Ref Range Status   SARS Coronavirus 2 NEGATIVE NEGATIVE Final    Comment: (NOTE) SARS-CoV-2 target nucleic acids are NOT DETECTED.  The SARS-CoV-2 RNA is generally  detectable in upper and lower respiratory specimens during the acute phase of infection. Negative results do not preclude SARS-CoV-2 infection, do not rule out co-infections with other pathogens, and should not be used as the sole basis for treatment or other patient management decisions. Negative results must be combined with  clinical observations, patient history, and epidemiological information. The expected result is Negative.  Fact Sheet for Patients: SugarRoll.be  Fact Sheet for Healthcare Providers: https://www.woods-mathews.com/  This test is not yet approved or cleared by the [place] FDA and  has been authorized for detection and/or diagnosis of SARS-CoV-2 by FDA under an Emergency Use Authorization (EUA). This EUA will remain  in effect (meaning this test can be used) for the duration of the COVID-19 declaration under Se ction 564(b)(1) of the Act, 21 U.S.C. section 360bbb-3(b)(1), unless the authorization is terminated or revoked sooner.  Performed at [facility], [place] [address], [place], [place] [address]   SARS Coronavirus 2 by RT PCR (hospital order, performed in [facility] hospital lab) Nasopharyngeal Nasopharyngeal Swab     Status: None   Collection Time: 02/19/20  5:08 PM   Specimen: Nasopharyngeal Swab  Result Value Ref Range Status   SARS Coronavirus 2 NEGATIVE NEGATIVE Final    Comment: (NOTE) SARS-CoV-2 target nucleic acids are NOT DETECTED.  The SARS-CoV-2 RNA is generally detectable in upper and lower respiratory specimens during the acute phase of infection. The lowest concentration of SARS-CoV-2 viral copies this assay can detect is 250 copies / mL. A negative result does not preclude SARS-CoV-2 infection and should not be used as the sole basis for treatment or other patient management decisions.  A negative result may occur with improper specimen collection / handling, submission of specimen  other than nasopharyngeal swab, presence of viral mutation(s) within the areas targeted by this assay, and inadequate number of viral copies (<250 copies / mL). A negative result must be combined with clinical observations, patient history, and epidemiological information.  Fact Sheet for Patients:   StrictlyIdeas.no  Fact Sheet for Healthcare Providers: BankingDealers.co.za  This test is not yet approved or  cleared by the [place] FDA and has been authorized for detection and/or diagnosis of SARS-CoV-2 by FDA under an Emergency Use Authorization (EUA).  This EUA will remain in effect (meaning this test can be used) for the duration of the COVID-19 declaration under Section 564(b)(1) of the Act, 21 U.S.C. section 360bbb-3(b)(1), unless the authorization is terminated or revoked sooner.  Performed at [facility], [place] [address], [place], [place] [address]   MRSA PCR Screening     Status: None   Collection Time: 02/20/20 12:00 AM   Specimen: Nasal Mucosa; Nasopharyngeal  Result Value Ref Range Status   MRSA by PCR NEGATIVE NEGATIVE Final    Comment:        The GeneXpert MRSA Assay (FDA approved for NASAL specimens only), is one component of a comprehensive MRSA colonization surveillance program. It is not intended to diagnose MRSA infection nor to guide or monitor treatment for MRSA infections. Performed at [facility], [place] [address], [place], [place] [address]     Procedures and diagnostic studies:  DG Chest Portable 1 View  Result Date: 02/19/2020 CLINICAL DATA:  Shortness of breath, unresponsive EXAM: PORTABLE CHEST 1 VIEW COMPARISON:  None. FINDINGS: Cardiac shadow is enlarged but accentuated by the portable technique. Left mid lung atelectatic changes are noted. Some central vascular congestion is noted which may be related to a poor inspiratory effort. No focal confluent  infiltrate is seen. No bony abnormality is noted. IMPRESSION: Mild vascular congestion which may be accentuated by the poor inspiratory effort. Mild left mid lung atelectatic changes. Electronically Signed   By: Inez Catalina M.D.   On: 02/19/2020 17:46  Medications:   . acetaminophen  500 mg Oral TID WC & HS  . Chlorhexidine Gluconate Cloth  6 each Topical Q0600  . enoxaparin (LOVENOX) injection  50 mg Subcutaneous Q24H  . polyethylene glycol  17 g Oral BID   Continuous Infusions: . sodium chloride 75 mL/hr (02/20/20 0126)  . methocarbamol (ROBAXIN) IV       LOS: 0 days   Jacquelynn Cree, MD  [facility]   [facility] How to contact the [facility] Attending or Consulting provider [place] or covering provider during after hours [place], for this patient?  1. Check the care team in [facility] and look for a) attending/consulting [facility] provider listed and b) the [facility] team listed 2. Log into [email or web address] and use [facility]'s universal password to access. If you do not have the password, please contact the hospital operator. 3. Locate the [facility] provider you are looking for under [facility] and page to a number that you can be directly reached. 4. If you still have difficulty reaching the provider, please page the [facility] (Director on Call) for the Hospitalists listed on amion for assistance.  02/20/2020, 7:39 AM

## 2020-02-20 NOTE — Care Plan (Signed)
Pt c/o feeling gassy, and states she has not had a BM in 3 days. Provider on call notified of this. New orders received. Also explained to pt that ambulating is important and will help. Pt verbalized understanding.

## 2020-02-20 NOTE — Progress Notes (Signed)
Jody Fitzgerald [id number] 11/04/1955  CARE TEAM:  PCP: Patient, No Pcp Per  Outpatient Care Team: Patient Care Team: Patient, No Pcp Per as PCP - General ([place]) Astrid Divine, MD as Referring Physician (Cardiology) Michael Boston, MD as Consulting Physician (General Surgery) Koleen Distance, MD as Referring Physician (Gastroenterology)  Inpatient Treatment Team: Treatment Team: Attending Provider: Rama, Venetia Maxon, MD; Rounding Team: Fatima Blank, MD; Occupational Therapist: Lenward Chancellor, OT; Utilization Review: Glean Salvo, RN; Physical Therapist: Fuller Mandril, PT; Registered Nurse: Launa Grill, Marcie Bal, RN; Technician: Regis Bill, NT; Consulting Physician: Michael Boston, MD; Case Manager: Frann Rider, RN   Problem List:   Principal Problem:   Acute respiratory failure with hypoxia and hypercapnia ([place]) Active Problems:   CKD (chronic kidney disease) stage 3, GFR 30-59 ml/min (HCC)   Obesity hypoventilation syndrome ([place])   History of right nephrectomy   Obesity (BMI 30-39.9)   Incisional hernia of right flank s/p lap repair w mesh 02/18/2020   Opioid overdose ([place])   OSA (obstructive sleep apnea)   Asthma   Hypertension   Pre-diabetes   Hepatic steatosis  SURGERY 02/18/2020  POST-OPERATIVE DIAGNOSIS:  INCISIONAL FLANK ABDOMINAL WALL HERNIA  PROCEDURE:   LAPAROSCOPIC REPAIR OF  INCISIONAL RIGHT FLANK ABDOMINAL WALL HERNIA WITH MESH  TAP BLOCK - BILATERAL  SURGEON:  Adin Hector, MD    Assessment  OK  [facility] Stay = 0 days)  Plan:  -no evidence of any peritonitis or cellulitis  Can advance diet as tolerated.  Can start with liquids first.  Patient complaining of constipation.  Apparently has not moved her bowels in 4 days, although she did not tell me this yesterday.  Placed on MiraLAX twice a day.  Give a double dose now.  Get her up and mobilize.  Tylenol around-the-clock and Robaxin as needed.  Consider  different narcotic.  Switch from oxycodone to hydrocodone instead.  I would consider gabapentin but do not know if that would be too too sedating.  Defer to primary medicine service if they are worried that that may be an issue.  Hold off an abdominal binder as it may affect her ability to breathe.  Okay to shower.  Give her ice pack/heating pads around-the-clock as well.  Get her mobilizing again.  She was walking adequately in the hallways yesterday.  Probable sleep apnea or at least hypoventilation syndrome in the setting of morbid obesity.  Defer to medicine/pulmonary on further work-up or needs.  Stable to discharge from surgery standpoint if adequate oral pain control which was happening yesterday.  Surgery can help follow and answer questions as needed.  Surgical dressings can come off postop day 3 = tomorrow morning.  -VTE prophylaxis- SCDs, etc -mobilize as tolerated to help recovery  Disposition:  Disposition:  The patient is from: Home  Anticipate discharge to:  Home  Anticipated Date of Discharge is:  February 22, 2020  Barriers to discharge:  Pending Clinical improvement (more likely than not  Patient currently is NOT MEDICALLY STABLE for discharge from the hospital from a surgery standpoint.   25 minutes spent in review, evaluation, examination, counseling, and coordination of care.  More than 50% of that time was spent in counseling.  02/20/2020    Subjective: (Chief complaint)  Patient apparently became sleepy and unresponsive as she was leaving the hospital.  Sent to the ER.  Given Narcan and improved.  Suspected hypoventilation syndrome.  In stepdown unit with observation.  Alert.  Complaining of constipation.  Denies nausea or vomiting.  Has soreness but not too severe.  Objective:  Vital signs:  Vitals:   02/20/20 0300 02/20/20 0400 02/20/20 0500 02/20/20 0613  BP: 137/74 (!) 156/81 (!) 130/59 (!) 122/59  Pulse: 68 75 70 85  Resp: 12 14 15 19   Temp:  99.1 F (37.3 C) 99.1 F (37.3 C) 99.1 F (37.3 C) 98.6 F (37 C)  TempSrc:  Oral Oral Oral  SpO2: 98% 97% 100% 97%  Weight:      Height:        Last BM Date:  (states no BM in 3 days)  Intake/Output   Yesterday:  01/20 0701 - 01/21 0700 In: 480 [P.O.:480] Out: 950 [Urine:950] This shift:  No intake/output data recorded.  Bowel function:  Flatus: YES  BM:  No  Drain: (No drain)   Physical Exam:  General: Pt awake/alert in no acute distress.  Alert.  Not toxic/sickly Eyes: PERRL, normal EOM.  Sclera clear.  No icterus Neuro: CN II-XII intact w/o focal sensory/motor deficits. Lymph: No head/neck/groin lymphadenopathy Psych:  No delerium/psychosis/paranoia.  Oriented x 4 HENT: Normocephalic, Mucus membranes moist.  No thrush Neck: Supple, No tracheal deviation.  No obvious thyromegaly Chest: No pain to chest wall compression.  Good respiratory excursion.  No audible wheezing CV:  Pulses intact.  Regular rhythm.  No major extremity edema MS: Normal AROM mjr joints.  No obvious deformity  Abdomen: Soft.  Nondistended.  Mildly tender at incisions only.  No evidence of peritonitis.  No incarcerated hernias.  Ext:  No deformity.  No mjr edema.  No cyanosis Skin: No petechiae / purpurea.  No major sores.  Warm and dry    Results:   Cultures: Recent Results (from the past 720 hour(s))  SARS CORONAVIRUS 2 (TAT 6-24 HRS) Nasopharyngeal Nasopharyngeal Swab     Status: None   Collection Time: 02/14/20  1:55 PM   Specimen: Nasopharyngeal Swab  Result Value Ref Range Status   SARS Coronavirus 2 NEGATIVE NEGATIVE Final    Comment: (NOTE) SARS-CoV-2 target nucleic acids are NOT DETECTED.  The SARS-CoV-2 RNA is generally detectable in upper and lower respiratory specimens during the acute phase of infection. Negative results do not preclude SARS-CoV-2 infection, do not rule out co-infections with other pathogens, and should not be used as the sole basis for treatment or  other patient management decisions. Negative results must be combined with clinical observations, patient history, and epidemiological information. The expected result is Negative.  Fact Sheet for Patients: SugarRoll.be  Fact Sheet for Healthcare Providers: https://www.woods-mathews.com/  This test is not yet approved or cleared by the [place] FDA and  has been authorized for detection and/or diagnosis of SARS-CoV-2 by FDA under an Emergency Use Authorization (EUA). This EUA will remain  in effect (meaning this test can be used) for the duration of the COVID-19 declaration under Se ction 564(b)(1) of the Act, 21 U.S.C. section 360bbb-3(b)(1), unless the authorization is terminated or revoked sooner.  Performed at [facility], [place] [address] [place], [place] [address]   SARS Coronavirus 2 by RT PCR (hospital order, performed in [facility] hospital lab) Nasopharyngeal Nasopharyngeal Swab     Status: None   Collection Time: 02/19/20  5:08 PM   Specimen: Nasopharyngeal Swab  Result Value Ref Range Status   SARS Coronavirus 2 NEGATIVE NEGATIVE Final    Comment: (NOTE) SARS-CoV-2 target nucleic acids are NOT DETECTED.  The SARS-CoV-2 RNA is  generally detectable in upper and lower respiratory specimens during the acute phase of infection. The lowest concentration of SARS-CoV-2 viral copies this assay can detect is 250 copies / mL. A negative result does not preclude SARS-CoV-2 infection and should not be used as the sole basis for treatment or other patient management decisions.  A negative result may occur with improper specimen collection / handling, submission of specimen other than nasopharyngeal swab, presence of viral mutation(s) within the areas targeted by this assay, and inadequate number of viral copies (<250 copies / mL). A negative result must be combined with clinical observations, patient history, and  epidemiological information.  Fact Sheet for Patients:   StrictlyIdeas.no  Fact Sheet for Healthcare Providers: BankingDealers.co.za  This test is not yet approved or  cleared by the [place] FDA and has been authorized for detection and/or diagnosis of SARS-CoV-2 by FDA under an Emergency Use Authorization (EUA).  This EUA will remain in effect (meaning this test can be used) for the duration of the COVID-19 declaration under Section 564(b)(1) of the Act, 21 U.S.C. section 360bbb-3(b)(1), unless the authorization is terminated or revoked sooner.  Performed at [facility], [place] [address] [place], [place] [address]   MRSA PCR Screening     Status: None   Collection Time: 02/20/20 12:00 AM   Specimen: Nasal Mucosa; Nasopharyngeal  Result Value Ref Range Status   MRSA by PCR NEGATIVE NEGATIVE Final    Comment:        The GeneXpert MRSA Assay (FDA approved for NASAL specimens only), is one component of a comprehensive MRSA colonization surveillance program. It is not intended to diagnose MRSA infection nor to guide or monitor treatment for MRSA infections. Performed at [facility], [place] [address], [place], [place] [address]     Labs: Results for orders placed or performed during the hospital encounter of 02/19/20 (from the past 48 hour(s))  CBG monitoring, ED     Status: Abnormal   Collection Time: 02/19/20  4:51 PM  Result Value Ref Range   Glucose-Capillary 176 (H) 70 - 99 mg/dL    Comment: Glucose reference range applies only to samples taken after fasting for at least 8 hours.  CBC with Differential     Status: Abnormal   Collection Time: 02/19/20  4:59 PM  Result Value Ref Range   WBC 12.6 (H) 4.0 - 10.5 K/uL   RBC 4.13 3.87 - 5.11 MIL/uL   Hemoglobin 12.8 12.0 - 15.0 g/dL   HCT 41.8 36.0 - 46.0 %   MCV 101.2 (H) 80.0 - 100.0 fL   MCH 31.0 26.0 - 34.0 pg   MCHC 30.6 30.0  - 36.0 g/dL   RDW 13.1 11.5 - 15.5 %   Platelets 295 150 - 400 K/uL   nRBC 0.0 0.0 - 0.2 %   Neutrophils Relative % 64 %   Neutro Abs 8.0 (H) 1.7 - 7.7 K/uL   Lymphocytes Relative 25 %   Lymphs Abs 3.1 0.7 - 4.0 K/uL   Monocytes Relative 11 %   Monocytes Absolute 1.4 (H) 0.1 - 1.0 K/uL   Eosinophils Relative 0 %   Eosinophils Absolute 0.0 0.0 - 0.5 K/uL   Basophils Relative 0 %   Basophils Absolute 0.0 0.0 - 0.1 K/uL   Immature Granulocytes 0 %   Abs Immature Granulocytes 0.04 0.00 - 0.07 K/uL    Comment: Performed at [facility], [place] [address], [place], [place] [address]  Basic  metabolic panel     Status: Abnormal   Collection Time: 02/19/20  4:59 PM  Result Value Ref Range   Sodium 134 (L) 135 - 145 mmol/L   Potassium 4.0 3.5 - 5.1 mmol/L   Chloride 95 (L) 98 - 111 mmol/L   CO2 29 22 - 32 mmol/L   Glucose, Bld 200 (H) 70 - 99 mg/dL    Comment: Glucose reference range applies only to samples taken after fasting for at least 8 hours.   BUN 21 8 - 23 mg/dL   Creatinine, Ser 1.35 (H) 0.44 - 1.00 mg/dL   Calcium 9.0 8.9 - 10.3 mg/dL   GFR, Estimated 44 (L) >60 mL/min    Comment: (NOTE) Calculated using the CKD-EPI Creatinine Equation (2021)    Anion gap 10 5 - 15    Comment: Performed at [facility], [place] [address] [place], [place] [address]  Brain natriuretic peptide     Status: Abnormal   Collection Time: 02/19/20  4:59 PM  Result Value Ref Range   B Natriuretic Peptide 150.4 (H) 0.0 - 100.0 pg/mL    Comment: Performed at [facility], [place] [address], [place], [place] [address]  Hepatic function panel     Status: None   Collection Time: 02/19/20  4:59 PM  Result Value Ref Range   Total Protein 7.2 6.5 - 8.1 g/dL   Albumin 3.9 3.5 - 5.0 g/dL   AST 37 15 - 41 U/L   ALT 29 0 - 44 U/L   Alkaline Phosphatase 67 38 - 126 U/L   Total Bilirubin 0.6 0.3 - 1.2 mg/dL   Bilirubin, Direct <0.1 0.0 - 0.2 mg/dL   Indirect  Bilirubin NOT CALCULATED 0.3 - 0.9 mg/dL    Comment: Performed at [facility], [place] [address], [place], [place] [address]  Troponin I (High Sensitivity)     Status: None   Collection Time: 02/19/20  4:59 PM  Result Value Ref Range   Troponin I (High Sensitivity) 7 <18 ng/L    Comment: (NOTE) Elevated high sensitivity troponin I (hsTnI) values and significant  changes across serial measurements may suggest ACS but many other  chronic and acute conditions are known to elevate hsTnI results.  Refer to the Links section for chest pain algorithms and additional  guidance. Performed at [facility], [place] [address] [place], [place] [address]   Magnesium     Status: None   Collection Time: 02/19/20  4:59 PM  Result Value Ref Range   Magnesium 2.3 1.7 - 2.4 mg/dL    Comment: Performed at [facility], [place] [address], [place], [place] [address]  I-stat chem 8, ed     Status: Abnormal   Collection Time: 02/19/20  5:05 PM  Result Value Ref Range   Sodium 134 (L) 135 - 145 mmol/L   Potassium 4.0 3.5 - 5.1 mmol/L   Chloride 96 (L) 98 - 111 mmol/L   BUN 21 8 - 23 mg/dL   Creatinine, Ser 1.40 (H) 0.44 - 1.00 mg/dL   Glucose, Bld 190 (H) 70 - 99 mg/dL    Comment: Glucose reference range applies only to samples taken after fasting for at least 8 hours.   Calcium, Ion 1.24 1.15 - 1.40 mmol/L   TCO2 30 22 - 32 mmol/L   Hemoglobin 14.3 12.0 - 15.0 g/dL   HCT 42.0 36.0 - 46.0 %  SARS Coronavirus 2 by RT PCR (hospital order,  performed in [facility] hospital lab) Nasopharyngeal Nasopharyngeal Swab     Status: None   Collection Time: 02/19/20  5:08 PM   Specimen: Nasopharyngeal Swab  Result Value Ref Range   SARS Coronavirus 2 NEGATIVE NEGATIVE    Comment: (NOTE) SARS-CoV-2 target nucleic acids are NOT DETECTED.  The SARS-CoV-2 RNA is generally detectable in upper and lower respiratory specimens during the acute phase of infection. The  lowest concentration of SARS-CoV-2 viral copies this assay can detect is 250 copies / mL. A negative result does not preclude SARS-CoV-2 infection and should not be used as the sole basis for treatment or other patient management decisions.  A negative result may occur with improper specimen collection / handling, submission of specimen other than nasopharyngeal swab, presence of viral mutation(s) within the areas targeted by this assay, and inadequate number of viral copies (<250 copies / mL). A negative result must be combined with clinical observations, patient history, and epidemiological information.  Fact Sheet for Patients:   StrictlyIdeas.no  Fact Sheet for Healthcare Providers: BankingDealers.co.za  This test is not yet approved or  cleared by the [place] FDA and has been authorized for detection and/or diagnosis of SARS-CoV-2 by FDA under an Emergency Use Authorization (EUA).  This EUA will remain in effect (meaning this test can be used) for the duration of the COVID-19 declaration under Section 564(b)(1) of the Act, 21 U.S.C. section 360bbb-3(b)(1), unless the authorization is terminated or revoked sooner.  Performed at [facility], [place] [address] [place], [place] [address]   Blood gas, venous     Status: Abnormal   Collection Time: 02/19/20  8:50 PM  Result Value Ref Range   pH, Ven 7.272 7.250 - 7.430   pCO2, Ven 71.3 (HH) 44.0 - 60.0 mmHg    Comment: CRITICAL RESULT CALLED TO, READ BACK BY AND VERIFIED WITH: BROOKS,B  02/19/20 @2109  BY SEEL,M    pO2, Ven 39.1 32.0 - 45.0 mmHg   Bicarbonate 31.8 (H) 20.0 - 28.0 mmol/L   Acid-Base Excess 3.5 (H) 0.0 - 2.0 mmol/L   O2 Saturation 68.3 %   Patient temperature 98.6     Comment: Performed at [facility], [place] [address] [place], [place] [address]  Rapid urine drug screen (hospital performed)     Status: Abnormal   Collection  Time: 02/19/20 11:25 PM  Result Value Ref Range   Opiates NONE DETECTED NONE DETECTED   Cocaine NONE DETECTED NONE DETECTED   Benzodiazepines POSITIVE (A) NONE DETECTED   Amphetamines NONE DETECTED NONE DETECTED   Tetrahydrocannabinol NONE DETECTED NONE DETECTED   Barbiturates NONE DETECTED NONE DETECTED    Comment: (NOTE) DRUG SCREEN FOR MEDICAL PURPOSES ONLY.  IF CONFIRMATION IS NEEDED FOR ANY PURPOSE, NOTIFY LAB WITHIN 5 DAYS.  LOWEST DETECTABLE LIMITS FOR URINE DRUG SCREEN Drug Class                     Cutoff (ng/mL) Amphetamine and metabolites    1000 Barbiturate and metabolites    200 Benzodiazepine                 [id number] Tricyclics and metabolites     300 Opiates and metabolites        300 Cocaine and metabolites        300 THC  50 Performed at [facility], [place] [address], [place], [place] [address]   Urinalysis, Routine w reflex microscopic Urine, Clean Catch     Status: None   Collection Time: 02/19/20 11:25 PM  Result Value Ref Range   Color, Urine YELLOW YELLOW   APPearance CLEAR CLEAR   Specific Gravity, Urine 1.013 1.005 - 1.030   pH 5.0 5.0 - 8.0   Glucose, UA NEGATIVE NEGATIVE mg/dL   Hgb urine dipstick NEGATIVE NEGATIVE   Bilirubin Urine NEGATIVE NEGATIVE   Ketones, ur NEGATIVE NEGATIVE mg/dL   Protein, ur NEGATIVE NEGATIVE mg/dL   Nitrite NEGATIVE NEGATIVE   Leukocytes,Ua NEGATIVE NEGATIVE    Comment: Performed at [facility], [place] [address] [place], [place] [address]  MRSA PCR Screening     Status: None   Collection Time: 02/20/20 12:00 AM   Specimen: Nasal Mucosa; Nasopharyngeal  Result Value Ref Range   MRSA by PCR NEGATIVE NEGATIVE    Comment:        The GeneXpert MRSA Assay (FDA approved for NASAL specimens only), is one component of a comprehensive MRSA colonization surveillance program. It is not intended to diagnose MRSA infection nor to guide or monitor treatment for MRSA  infections. Performed at [facility], [place] [address], [place], [place] [address]   Troponin I (High Sensitivity)     Status: None   Collection Time: 02/20/20 12:33 AM  Result Value Ref Range   Troponin I (High Sensitivity) 12 <18 ng/L    Comment: (NOTE) Elevated high sensitivity troponin I (hsTnI) values and significant  changes across serial measurements may suggest ACS but many other  chronic and acute conditions are known to elevate hsTnI results.  Refer to the "Links" section for chest pain algorithms and additional  guidance. Performed at [facility], [place] [address] [place], [place] [address]   Magnesium     Status: None   Collection Time: 02/20/20 12:33 AM  Result Value Ref Range   Magnesium 2.3 1.7 - 2.4 mg/dL    Comment: Performed at [facility], [place] [address], [place], [place] [address]  Phosphorus     Status: None   Collection Time: 02/20/20 12:33 AM  Result Value Ref Range   Phosphorus 3.1 2.5 - 4.6 mg/dL    Comment: Performed at [facility], [place] [address], [place], [place] [address]  CBC WITH DIFFERENTIAL     Status: Abnormal   Collection Time: 02/20/20 12:33 AM  Result Value Ref Range   WBC 9.5 4.0 - 10.5 K/uL   RBC 3.86 (L) 3.87 - 5.11 MIL/uL   Hemoglobin 11.9 (L) 12.0 - 15.0 g/dL   HCT 39.0 36.0 - 46.0 %   MCV 101.0 (H) 80.0 - 100.0 fL   MCH 30.8 26.0 - 34.0 pg   MCHC 30.5 30.0 - 36.0 g/dL   RDW 12.9 11.5 - 15.5 %   Platelets 216 150 - 400 K/uL   nRBC 0.0 0.0 - 0.2 %   Neutrophils Relative % 66 %   Neutro Abs 6.2 1.7 - 7.7 K/uL   Lymphocytes Relative 23 %   Lymphs Abs 2.2 0.7 - 4.0 K/uL   Monocytes Relative 11 %   Monocytes Absolute 1.0 0.1 - 1.0 K/uL   Eosinophils Relative 0 %   Eosinophils Absolute 0.0 0.0 - 0.5 K/uL   Basophils Relative 0 %   Basophils Absolute 0.0 0.0 - 0.1 K/uL   Immature Granulocytes 0 %  Abs Immature Granulocytes 0.02 0.00 - 0.07 K/uL     Comment: Performed at [facility], [place] [address], [place], [place] [address]  TSH     Status: Abnormal   Collection Time: 02/20/20 12:33 AM  Result Value Ref Range   TSH 7.856 (H) 0.350 - 4.500 uIU/mL    Comment: Performed by a 3rd Generation assay with a functional sensitivity of <=0.01 uIU/mL. Performed at [facility], [place] [address] [place], [place] [address]   Comprehensive metabolic panel     Status: Abnormal   Collection Time: 02/20/20 12:33 AM  Result Value Ref Range   Sodium 137 135 - 145 mmol/L   Potassium 3.9 3.5 - 5.1 mmol/L   Chloride 99 98 - 111 mmol/L   CO2 29 22 - 32 mmol/L   Glucose, Bld 127 (H) 70 - 99 mg/dL    Comment: Glucose reference range applies only to samples taken after fasting for at least 8 hours.   BUN 20 8 - 23 mg/dL   Creatinine, Ser 1.30 (H) 0.44 - 1.00 mg/dL   Calcium 8.7 (L) 8.9 - 10.3 mg/dL   Total Protein 6.6 6.5 - 8.1 g/dL   Albumin 3.6 3.5 - 5.0 g/dL   AST 32 15 - 41 U/L   ALT 23 0 - 44 U/L   Alkaline Phosphatase 64 38 - 126 U/L   Total Bilirubin 0.5 0.3 - 1.2 mg/dL   GFR, Estimated 46 (L) >60 mL/min    Comment: (NOTE) Calculated using the CKD-EPI Creatinine Equation (2021)    Anion gap 9 5 - 15    Comment: Performed at [facility], [place] [address] [place], [place] [address]  T4, free     Status: None   Collection Time: 02/20/20  3:08 AM  Result Value Ref Range   Free T4 0.68 0.61 - 1.12 ng/dL    Comment: (NOTE) Biotin ingestion may interfere with free T4 tests. If the results are inconsistent with the TSH level, previous test results, or the clinical presentation, then consider biotin interference. If needed, order repeat testing after stopping biotin. Performed at [facility], [place] [address], [place], [place] [address]     Imaging / Studies: DG Chest Portable 1 View  Result Date: 02/19/2020 CLINICAL DATA:  Shortness of breath, unresponsive EXAM: PORTABLE CHEST  1 VIEW COMPARISON:  None. FINDINGS: Cardiac shadow is enlarged but accentuated by the portable technique. Left mid lung atelectatic changes are noted. Some central vascular congestion is noted which may be related to a poor inspiratory effort. No focal confluent infiltrate is seen. No bony abnormality is noted. IMPRESSION: Mild vascular congestion which may be accentuated by the poor inspiratory effort. Mild left mid lung atelectatic changes. Electronically Signed   By: Inez Catalina M.D.   On: 02/19/2020 17:46    Medications / Allergies: per chart  Antibiotics: Anti-infectives (From admission, onward)   None        Note: Portions of this report may have been transcribed using voice recognition software. Every effort was made to ensure accuracy; however, inadvertent computerized transcription errors may be present.   Any transcriptional errors that result from this process are unintentional.    Adin Hector, MD, FACS, MASCRS Gastrointestinal and Minimally Invasive Surgery  [facility] [address], [place], [place] [address] [phone] Fax [phone] Main/Paging  CONTACT INFORMATION: Weekday (9AM-5PM) concerns: Call [facility] main office at [phone] Weeknight (5PM-9AM) or [facility]  concerns: Check [email or web address] for General Surgery [facility] coverage (Please, do not use SecureChat as it is not reliable communication to operating surgeons for immediate patient care)      02/20/2020  7:45 AM

## 2020-02-20 NOTE — Discharge Instructions (Signed)
Trude Mcburney Textbook of Surgery (20th ed., pp. [phone]). Elsevier.">  Reparacin laparoscpica de una hernia ventral, cuidados posteriores Laparoscopic Ventral Hernia Repair, Care After La siguiente informacin ofrece orientacin sobre cmo cuidarse despus del procedimiento. El mdico tambin podr darle instrucciones ms especficas. Comunquese con el mdico si tiene problemas o preguntas. Qu puedo esperar despus del procedimiento? Despus del procedimiento, es normal tener dolor, molestias o sensibilidad. Siga estas instrucciones en su casa: Medicamentos  Use los medicamentos de venta libre y los recetados solamente como se lo haya indicado el mdico.  Pregntele al mdico si el medicamento recetado: ? Hace necesario que evite conducir o Musician. ? Puede causarle estreimiento. Es posible que tenga que tomar estas medidas para prevenir o tratar el estreimiento:  Electronics engineer suficiente lquido como para Theatre manager la orina de color amarillo plido.  Usar medicamentos recetados o de Radio broadcast assistant.  Consumir alimentos ricos en fibra, como frijoles, cereales integrales, y frutas y verduras frescas.  Limitar el consumo de alimentos ricos en grasa y azcares procesados, como los alimentos fritos o dulces. Cuidado de la incisin  Siga las instrucciones del mdico acerca del cuidado de la incisin. Asegrese de hacer lo siguiente: ? Lvese las manos con agua y jabn durante al menos 20segundos antes y despus de cambiarse la venda (vendaje) o antes de tocarse el abdomen. Use desinfectante para manos si no dispone de [place]. ? Cambie el vendaje como se lo haya indicado el mdico. ? No retire los puntos (suturas), la goma para cerrar la piel o las tiras [place]. Es posible que estos cierres cutneos deban quedar puestos en la piel durante 2semanas o ms tiempo. Si los bordes de las tiras adhesivas empiezan a despegarse y Therapist, sports, puede recortar los que estn sueltos. No retire las  tiras [facility] por completo a menos que el mdico se lo indique.  Psychiatric nurse de la incisin todos los das para detectar signos de infeccin. Est atento a los siguientes signos: ? Aumento del enrojecimiento, la hinchazn o Conservation officer, historic buildings. ? Lquido o sangre. ? Calor. ? Pus o mal olor.   Baarse  No tome baos de inmersin, no nade ni use el jacuzzi hasta que el mdico lo autorice. Pregntele al mdico si puede ducharse. Thurston Pounds solo le permitan darse baos de [place].  Mantenga seco el vendaje hasta que el mdico le diga que se lo puede quitar.   Actividad  Haga reposo como se lo haya indicado el mdico.  Evite estar sentado durante largos perodos sin moverse. Levntese y camine un poco cada 1 a 2 horas. Esto es importante para mejorar el flujo sanguneo y la respiracin. Pida ayuda si se siente dbil o inestable.  No levante ningn objeto que pese ms de10 libras (4.5kg) o que supere el lmite de peso que le hayan indicado, hasta que el mdico le diga que puede [place].  Si le administraron un sedante durante el procedimiento, puede afectarlo por varias horas. No conduzca ni opere maquinaria hasta que el mdico le indique que es seguro [place].  Retome sus actividades normales segn lo indicado por el mdico. Pregntele al mdico qu actividades son seguras para usted.   Indicaciones generales  Sostenga una almohada sobre el abdomen cuando tosa o estornude. Esto ayuda a Best boy.  No consuma ningn producto que contenga nicotina o tabaco. Estos productos incluyen cigarrillos, tabaco para Higher education careers adviser y aparatos de vapeo, como los Psychologist, sport and exercise. Estos pueden retrasar la cicatrizacin despus de la ciruga. Si necesita ayuda  para dejar de consumir estos productos, consulte al [employer].  Posiblemente le pidan que contine realizando ejercicios de respiracin profunda en su hogar. Esto ayudar a Doctor, general practice.  Cumpla con todas las visitas de  seguimiento. Esto es importante.   Comunquese con un mdico si:  Tiene cualquiera de estos signos de infeccin: ? Ms enrojecimiento, hinchazn o dolor alrededor de una incisin. ? Sangre o lquido provenientes de una incisin. ? Calor proveniente de una incisin. ? Pus o mal olor provenientes de una incisin. ? Cristy Hilts o escalofros.  Tiene un dolor que empeora o que no mejora con los medicamentos.  Tiene nuseas o vmitos.  Tiene tos.  Le falta el aire.  No defeca durante 3das.  No puede orinar. Solicite ayuda de inmediato si tiene:  Dolor intenso en el abdomen.  Nuseas o vmitos persistentes.  Enrojecimiento, calor o dolor en la pierna.  Dolor de pecho.  Dificultad para respirar. Estos sntomas pueden representar un problema grave que constituye Engineer, maintenance (IT). No espere a ver si los sntomas desaparecen. Solicite atencin mdica de inmediato. Comunquese con el servicio de emergencias de su localidad (911 en los [place]). No conduzca por sus propios medios [employer]. Resumen  Despus de este procedimiento, es normal tener dolor, molestias o sensibilidad.  Siga las instrucciones del mdico acerca del cuidado de la incisin.  Controle la zona de la incisin todos los das para detectar signos de infeccin. Avsele al mdico sobre cualquier signo de infeccin.  Cumpla con todas las visitas de seguimiento. Esto es importante. Esta informacin no tiene Marine scientist el consejo del mdico. Asegrese de hacerle al mdico cualquier pregunta que tenga. Document Revised: 11/13/2019 Document Reviewed: 11/13/2019 Elsevier Patient Education  2021 [employer].

## 2020-02-20 NOTE — Evaluation (Signed)
Physical Therapy Evaluation Patient Details Name: Jody Fitzgerald MRN: [id number] DOB: 1955-11-22 Today's Date: 02/20/2020   History of Present Illness  Jody Fitzgerald is a 65 y.o. female with medical history significant of obesity,  asthma, CKD stage III status post nephrectomy, HTN, prediabetes.Underwent elective repair of incisional flank abdominal wall hernia 02/18/20, no documented complications.  On 1/20, patient was discharged.  After discharge while awaiting ride, patient became suddenly unresponsive, hypoxic. Rushed to ER by rapid response. Narcan given with improved arousal.  Clinical Impression  The patient is moving slowly, requiring assistance with bed mobility and ambulation using RW. Patient has no support at home. Patient resting on RA  95%, On RA while ambulating dropped to 74%. Placed on 2 L with return to > 90%.  Patient currently not deemed safe to Dc without support. Pt admitted with above diagnosis.  Pt currently with functional limitations due to the deficits listed below (see PT Problem List). Pt will benefit from skilled PT to increase their independence and safety with mobility to allow discharge to the venue listed below.    encouraged patient to ask for assistance to ambulate to BR. RN aware.      Follow Up Recommendations Home health PT;SNF    Equipment Recommendations  Rolling walker with 5" wheels    Recommendations for Other Services       Precautions / Restrictions Precautions Precautions: Fall Precaution Comments: monitor sats      Mobility  Bed Mobility Overal bed mobility: Needs Assistance Bed Mobility: Supine to Sit;Rolling;Sidelying to Sit     Supine to sit: Mod assist     General bed mobility comments: patient initiated but struggling so assisted with legs and trunk,    Transfers Overall transfer level: Needs assistance Equipment used: Rolling walker (2 wheeled) Transfers: Sit to/from Stand Sit to Stand: Min guard         General  transfer comment: extra time to rise from bed and toilet. Guarded abdomen.  Ambulation/Gait Ambulation/Gait assistance: Min guard;Min assist Gait Distance (Feet): 20 Feet Assistive device: Rolling walker (2 wheeled) Gait Pattern/deviations: Step-through pattern;Trunk flexed Gait velocity: decr   General Gait Details: slow to progress ambulation. tends too lean forward.  Stairs            Wheelchair Mobility    Modified Rankin (Stroke Patients Only)       Balance Overall balance assessment: Needs assistance Sitting-balance support: Feet supported;Bilateral upper extremity supported Sitting balance-Leahy Scale: Fair     Standing balance support: During functional activity;Single extremity supported Standing balance-Leahy Scale: Fair Standing balance comment: for toileting                             Pertinent Vitals/Pain Pain Assessment: 0-10 Pain Score: 3  Pain Location: abdomen Pain Descriptors / Indicators: Discomfort Pain Intervention(s): Monitored during session;Premedicated before session    Home Living Family/patient expects to be discharged to:: Private residence Living Arrangements: Alone Available Help at Discharge: Family Type of Home: Apartment Home Access: Elevator     Home Layout: One level Home Equipment: None Additional Comments: no family available to assist patient    Prior Function Level of Independence: Independent               Hand Dominance        Extremity/Trunk Assessment        Lower Extremity Assessment Lower Extremity Assessment: Generalized weakness    Cervical / Trunk Assessment Cervical /  Trunk Assessment: Normal (guarding abdomen)  Communication   Communication: Prefers language other than English  Cognition Arousal/Alertness: Awake/alert Behavior During Therapy: WFL for tasks assessed/performed Overall Cognitive Status: Within Functional Limits for tasks assessed                                  General Comments: interpreter used.      General Comments      Exercises     Assessment/Plan    PT Assessment Patient needs continued PT services  PT Problem List Decreased strength;Decreased activity tolerance;Decreased mobility;Pain;Decreased knowledge of use of DME       PT Treatment Interventions DME instruction;Gait training;Therapeutic activities;Patient/family education;Functional mobility training    PT Goals (Current goals can be found in the Care Plan section)  Acute Rehab PT Goals Patient Stated Goal: to go home PT Goal Formulation: With patient Time For Goal Achievement: 03/05/20 Potential to Achieve Goals: Good    Frequency Min 3X/week   Barriers to discharge Decreased caregiver support      Co-evaluation PT/OT/SLP Co-Evaluation/Treatment: Yes Reason for Co-Treatment: For patient/therapist safety PT goals addressed during session: Mobility/safety with mobility         AM-PAC PT "6 Clicks" Mobility  Outcome Measure Help needed turning from your back to your side while in a flat bed without using bedrails?: A Lot Help needed moving from lying on your back to sitting on the side of a flat bed without using bedrails?: A Lot Help needed moving to and from a bed to a chair (including a wheelchair)?: A Little Help needed standing up from a chair using your arms (e.g., wheelchair or bedside chair)?: A Little Help needed to walk in hospital room?: A Little Help needed climbing 3-5 steps with a railing? : A Lot 6 Click Score: 15    End of Session Equipment Utilized During Treatment: Oxygen Activity Tolerance: Patient limited by pain;Patient limited by fatigue Patient left: in chair;with call bell/phone within reach Nurse Communication: Mobility status (assist to ambulate to BR) PT Visit Diagnosis: Unsteadiness on feet (R26.81);Difficulty in walking, not elsewhere classified (R26.2);Pain    Time: 5284-1324 PT Time Calculation (min)  (ACUTE ONLY): 29 min   Charges:   PT Evaluation $PT Eval Low Complexity: [facility] Acute Rehabilitation Services Pager [phone] Office [phone]   Claretha Cooper 02/20/2020, 3:44 PM

## 2020-02-21 ENCOUNTER — Inpatient Hospital Stay (HOSPITAL_COMMUNITY): Payer: Medicare HMO

## 2020-02-21 DIAGNOSIS — J9602 Acute respiratory failure with hypercapnia: Secondary | ICD-10-CM | POA: Diagnosis not present

## 2020-02-21 DIAGNOSIS — J9601 Acute respiratory failure with hypoxia: Secondary | ICD-10-CM | POA: Diagnosis not present

## 2020-02-21 LAB — COMPREHENSIVE METABOLIC PANEL
ALT: 20 U/L (ref 0–44)
AST: 27 U/L (ref 15–41)
Albumin: 3.1 g/dL — ABNORMAL LOW (ref 3.5–5.0)
Alkaline Phosphatase: 56 U/L (ref 38–126)
Anion gap: 6 (ref 5–15)
BUN: 13 mg/dL (ref 8–23)
CO2: 30 mmol/L (ref 22–32)
Calcium: 8.4 mg/dL — ABNORMAL LOW (ref 8.9–10.3)
Chloride: 102 mmol/L (ref 98–111)
Creatinine, Ser: 0.89 mg/dL (ref 0.44–1.00)
GFR, Estimated: >60 mL/min (ref >60)
Glucose, Bld: 122 mg/dL — ABNORMAL HIGH (ref 70–99)
Potassium: 4 mmol/L (ref 3.5–5.1)
Sodium: 138 mmol/L (ref 135–145)
Total Bilirubin: 0.5 mg/dL (ref 0.3–1.2)
Total Protein: 6.2 g/dL — ABNORMAL LOW (ref 6.5–8.1)

## 2020-02-21 LAB — T3: T3, Total: 104 ng/dL (ref 71–180)

## 2020-02-21 MED ORDER — SENNOSIDES-DOCUSATE SODIUM 8.6-50 MG PO TABS
1.0000 | ORAL_TABLET | Freq: Two times a day (BID) | ORAL | Status: DC
  Administered 2020-02-21: 1 via ORAL
  Filled 2020-02-21 (×2): qty 1

## 2020-02-21 MED ORDER — SIMETHICONE 80 MG PO CHEW
80.0000 mg | CHEWABLE_TABLET | Freq: Four times a day (QID) | ORAL | Status: DC
  Administered 2020-02-21 – 2020-02-22 (×4): 80 mg via ORAL
  Filled 2020-02-21 (×6): qty 1

## 2020-02-21 MED ORDER — LACTULOSE 10 GM/15ML PO SOLN
20.0000 g | Freq: Two times a day (BID) | ORAL | Status: DC
  Administered 2020-02-21: 20 g via ORAL
  Filled 2020-02-21: qty 30

## 2020-02-21 MED ORDER — METHOCARBAMOL 500 MG PO TABS
500.0000 mg | ORAL_TABLET | Freq: Three times a day (TID) | ORAL | Status: DC | PRN
  Administered 2020-02-21: 500 mg via ORAL
  Filled 2020-02-21: qty 1

## 2020-02-21 NOTE — Progress Notes (Signed)
Philippine Newvine [id number] 03-31-1955  CARE TEAM:  PCP: Patient, No Pcp Per  Outpatient Care Team: Patient Care Team: Patient, No Pcp Per as PCP - General (General Practice) Astrid Divine, MD as Referring Physician (Cardiology) Michael Boston, MD as Consulting Physician (General Surgery) Koleen Distance, MD as Referring Physician (Gastroenterology)  Inpatient Treatment Team: Treatment Team: Attending Provider: Lavina Hamman, MD; Rounding Team: Fatima Blank, MD; Physical Therapist: Fuller Mandril, PT; Consulting Physician: Michael Boston, MD; Registered Nurse: Delford Field, RN; Technician: Cherlyn Labella, NT; Utilization Review: Conception Oms, RN   Problem List:   Principal Problem:   Acute respiratory failure with hypoxia and hypercapnia [facility]) Active Problems:   History of right nephrectomy   Obesity (BMI 30-39.9)   Incisional hernia of right flank s/p lap repair w mesh 02/18/2020   Opioid overdose (HCC)   OSA (obstructive sleep apnea)   CKD (chronic kidney disease) stage 3, GFR 30-59 ml/min (HCC)   Asthma   Hypertension   Pre-diabetes   Hepatic steatosis   Obesity hypoventilation syndrome (HCC)   Hyperglycemia   AKI (acute kidney injury) ([place])   Acute blood loss anemia   Elevated TSH   Acute respiratory failure ([place])  SURGERY 02/18/2020  POST-OPERATIVE DIAGNOSIS:  INCISIONAL FLANK ABDOMINAL WALL HERNIA  PROCEDURE:   LAPAROSCOPIC REPAIR OF  INCISIONAL RIGHT FLANK ABDOMINAL WALL HERNIA WITH MESH  TAP BLOCK - BILATERAL  SURGEON:  Adin Hector, MD    Assessment  OK  [facility] Stay = 1 days)  Plan:  -no evidence of any peritonitis or cellulitis  Can advance diet as tolerated. Miralax for constipation.  Get her up and mobilize.  Tylenol around-the-clock and Robaxin as needed.  PRN hydrocodone.   Hold off an abdominal binder as it may affect her ability to breathe.  Ice packs PRN.  Okay to shower.    Probable sleep apnea or at least  hypoventilation syndrome in the setting of morbid obesity.  Defer to medicine/pulmonary on further work-up or needs.  Ok to d/c when passing gas.  Xrays today have not been read but appear to show colonic gas.    Surgical dressings can come off postop day 3 = tomorrow morning.  -VTE prophylaxis- SCDs, etc -mobilize as tolerated to help recovery  Disposition:  Disposition:  The patient is from: Home  Anticipate discharge to:  Home  Anticipated Date of Discharge is:  February 22, 2020  Barriers to discharge:  Pending Clinical improvement (more likely than not  Patient currently is NOT MEDICALLY STABLE for discharge from the hospital from a surgery standpoint.     02/21/2020    Subjective: (Chief complaint)  Patient apparently became sleepy and unresponsive as she was leaving the hospital 02/18/2020.  Sent to the ER.  Given Narcan and improved.  Suspected hypoventilation syndrome.  Has gotten miralax, but no BM yet.    Denies nausea or vomiting.  Has soreness but not too severe.  Objective:  Vital signs:  Vitals:   02/20/20 1200 02/20/20 1226 02/20/20 2051 02/21/20 0515  BP: (!) 144/76 127/80 (!) 147/81 122/86  Pulse: 77 75 91 85  Resp: 15 16 20 18   Temp:  98.3 F (36.8 C) 98.8 F (37.1 C) 98.9 F (37.2 C)  TempSrc:  Oral Oral Oral  SpO2: 98% 98% 97% 97%  Weight:      Height:        Last BM Date:  (PTA)  Intake/Output   Yesterday:  01/21 [id number] -  01/22 0700 In: 1365.4 [P.O.:720; I.V.:645.4] Out: 100 [Urine:100] This shift:  No intake/output data recorded.  Bowel function:  Flatus: YES  BM:  No  Drain: (No drain)   Physical Exam:  General: Pt awake/alert in no acute distress.  Alert.  Not toxic/sickly Eyes: PERRL, normal EOM.  Sclera clear.  No icterus Psych:  No delerium/psychosis/paranoia.  Oriented x 4 HENT: Normocephalic, Mucus membranes moist.  No thrush Chest: No pain to chest wall compression.  Good respiratory excursion.  No audible  wheezing CV:  Pulses intact.  Regular rhythm.  No major extremity edema  Abdomen: Soft.  Protuberant.   Mildly tender at incisions only.  No evidence of peritonitis.  No incarcerated hernias.  Ext:  No deformity.  No mjr edema.  No cyanosis Skin: No petechiae / purpurea.  No major sores.  Warm and dry    Results:   Cultures: Recent Results (from the past 720 hour(s))  SARS CORONAVIRUS 2 (TAT 6-24 HRS) Nasopharyngeal Nasopharyngeal Swab     Status: None   Collection Time: 02/14/20  1:55 PM   Specimen: Nasopharyngeal Swab  Result Value Ref Range Status   SARS Coronavirus 2 NEGATIVE NEGATIVE Final    Comment: (NOTE) SARS-CoV-2 target nucleic acids are NOT DETECTED.  The SARS-CoV-2 RNA is generally detectable in upper and lower respiratory specimens during the acute phase of infection. Negative results do not preclude SARS-CoV-2 infection, do not rule out co-infections with other pathogens, and should not be used as the sole basis for treatment or other patient management decisions. Negative results must be combined with clinical observations, patient history, and epidemiological information. The expected result is Negative.  Fact Sheet for Patients: HairSlick.nohttps://www.fda.gov/media/138098/download  Fact Sheet for Healthcare Providers: quierodirigir.comhttps://www.fda.gov/media/138095/download  This test is not yet approved or cleared by the [place] FDA and  has been authorized for detection and/or diagnosis of SARS-CoV-2 by FDA under an Emergency Use Authorization (EUA). This EUA will remain  in effect (meaning this test can be used) for the duration of the COVID-19 declaration under Se ction 564(b)(1) of the Act, 21 U.S.C. section 360bbb-3(b)(1), unless the authorization is terminated or revoked sooner.  Performed at [facility], [address] [place], [place] [address]   SARS Coronavirus 2 by RT PCR (hospital order, performed in [facility] hospital lab) Nasopharyngeal  Nasopharyngeal Swab     Status: None   Collection Time: 02/19/20  5:08 PM   Specimen: Nasopharyngeal Swab  Result Value Ref Range Status   SARS Coronavirus 2 NEGATIVE NEGATIVE Final    Comment: (NOTE) SARS-CoV-2 target nucleic acids are NOT DETECTED.  The SARS-CoV-2 RNA is generally detectable in upper and lower respiratory specimens during the acute phase of infection. The lowest concentration of SARS-CoV-2 viral copies this assay can detect is 250 copies / mL. A negative result does not preclude SARS-CoV-2 infection and should not be used as the sole basis for treatment or other patient management decisions.  A negative result may occur with improper specimen collection / handling, submission of specimen other than nasopharyngeal swab, presence of viral mutation(s) within the areas targeted by this assay, and inadequate number of viral copies (<250 copies / mL). A negative result must be combined with clinical observations, patient history, and epidemiological information.  Fact Sheet for Patients:   BoilerBrush.com.cyhttps://www.fda.gov/media/136312/download  Fact Sheet for Healthcare Providers: https://pope.com/https://www.fda.gov/media/136313/download  This test is not yet approved or  cleared by the [place] FDA and has been authorized for detection and/or diagnosis of  SARS-CoV-2 by FDA under an Emergency Use Authorization (EUA).  This EUA will remain in effect (meaning this test can be used) for the duration of the COVID-19 declaration under Section 564(b)(1) of the Act, 21 U.S.C. section 360bbb-3(b)(1), unless the authorization is terminated or revoked sooner.  Performed at [facility], [place] [address], [place], [place] [address]   MRSA PCR Screening     Status: None   Collection Time: 02/20/20 12:00 AM   Specimen: Nasal Mucosa; Nasopharyngeal  Result Value Ref Range Status   MRSA by PCR NEGATIVE NEGATIVE Final    Comment:        The GeneXpert MRSA Assay (FDA approved for  NASAL specimens only), is one component of a comprehensive MRSA colonization surveillance program. It is not intended to diagnose MRSA infection nor to guide or monitor treatment for MRSA infections. Performed at [facility], [place] [address], [place], [place] [address]     Labs: Results for orders placed or performed during the hospital encounter of 02/19/20 (from the past 48 hour(s))  CBG monitoring, ED     Status: Abnormal   Collection Time: 02/19/20  4:51 PM  Result Value Ref Range   Glucose-Capillary 176 (H) 70 - 99 mg/dL    Comment: Glucose reference range applies only to samples taken after fasting for at least 8 hours.  CBC with Differential     Status: Abnormal   Collection Time: 02/19/20  4:59 PM  Result Value Ref Range   WBC 12.6 (H) 4.0 - 10.5 K/uL   RBC 4.13 3.87 - 5.11 MIL/uL   Hemoglobin 12.8 12.0 - 15.0 g/dL   HCT 41.8 36.0 - 46.0 %   MCV 101.2 (H) 80.0 - 100.0 fL   MCH 31.0 26.0 - 34.0 pg   MCHC 30.6 30.0 - 36.0 g/dL   RDW 13.1 11.5 - 15.5 %   Platelets 295 150 - 400 K/uL   nRBC 0.0 0.0 - 0.2 %   Neutrophils Relative % 64 %   Neutro Abs 8.0 (H) 1.7 - 7.7 K/uL   Lymphocytes Relative 25 %   Lymphs Abs 3.1 0.7 - 4.0 K/uL   Monocytes Relative 11 %   Monocytes Absolute 1.4 (H) 0.1 - 1.0 K/uL   Eosinophils Relative 0 %   Eosinophils Absolute 0.0 0.0 - 0.5 K/uL   Basophils Relative 0 %   Basophils Absolute 0.0 0.0 - 0.1 K/uL   Immature Granulocytes 0 %   Abs Immature Granulocytes 0.04 0.00 - 0.07 K/uL    Comment: Performed at [facility], [place] [address] [place], [place] [address]  Basic metabolic panel     Status: Abnormal   Collection Time: 02/19/20  4:59 PM  Result Value Ref Range   Sodium 134 (L) 135 - 145 mmol/L   Potassium 4.0 3.5 - 5.1 mmol/L   Chloride 95 (L) 98 - 111 mmol/L   CO2 29 22 - 32 mmol/L   Glucose, Bld 200 (H) 70 - 99 mg/dL    Comment: Glucose reference range applies only to samples taken after  fasting for at least 8 hours.   BUN 21 8 - 23 mg/dL   Creatinine, Ser 1.35 (H) 0.44 - 1.00 mg/dL   Calcium 9.0 8.9 - 10.3 mg/dL   GFR, Estimated 44 (L) >60 mL/min    Comment: (NOTE) Calculated using the CKD-EPI Creatinine Equation (2021)    Anion gap 10 5 - 15    Comment: Performed at [facility], [place] [address], [place], [place] [address]  Brain natriuretic peptide     Status: Abnormal   Collection Time: 02/19/20  4:59 PM  Result Value Ref Range   B Natriuretic Peptide 150.4 (H) 0.0 - 100.0 pg/mL    Comment: Performed at [facility], [place] [address], [place], [place] [address]  Hepatic function panel     Status: None   Collection Time: 02/19/20  4:59 PM  Result Value Ref Range   Total Protein 7.2 6.5 - 8.1 g/dL   Albumin 3.9 3.5 - 5.0 g/dL   AST 37 15 - 41 U/L   ALT 29 0 - 44 U/L   Alkaline Phosphatase 67 38 - 126 U/L   Total Bilirubin 0.6 0.3 - 1.2 mg/dL   Bilirubin, Direct <0.1 0.0 - 0.2 mg/dL   Indirect Bilirubin NOT CALCULATED 0.3 - 0.9 mg/dL    Comment: Performed at [facility], [place] [address] [place], [place] [address]  Troponin I (High Sensitivity)     Status: None   Collection Time: 02/19/20  4:59 PM  Result Value Ref Range   Troponin I (High Sensitivity) 7 <18 ng/L    Comment: (NOTE) Elevated high sensitivity troponin I (hsTnI) values and significant  changes across serial measurements may suggest ACS but many other  chronic and acute conditions are known to elevate hsTnI results.  Refer to the Links section for chest pain algorithms and additional  guidance. Performed at [facility], [place] [address] [place], [place] [address]   Magnesium     Status: None   Collection Time: 02/19/20  4:59 PM  Result Value Ref Range   Magnesium 2.3 1.7 - 2.4 mg/dL    Comment: Performed at [facility], [place] [address] [place], [place] [address]  I-stat chem 8, ed     Status:  Abnormal   Collection Time: 02/19/20  5:05 PM  Result Value Ref Range   Sodium 134 (L) 135 - 145 mmol/L   Potassium 4.0 3.5 - 5.1 mmol/L   Chloride 96 (L) 98 - 111 mmol/L   BUN 21 8 - 23 mg/dL   Creatinine, Ser 1.40 (H) 0.44 - 1.00 mg/dL   Glucose, Bld 190 (H) 70 - 99 mg/dL    Comment: Glucose reference range applies only to samples taken after fasting for at least 8 hours.   Calcium, Ion 1.24 1.15 - 1.40 mmol/L   TCO2 30 22 - 32 mmol/L   Hemoglobin 14.3 12.0 - 15.0 g/dL   HCT 42.0 36.0 - 46.0 %  SARS Coronavirus 2 by RT PCR (hospital order, performed in [facility] hospital lab) Nasopharyngeal Nasopharyngeal Swab     Status: None   Collection Time: 02/19/20  5:08 PM   Specimen: Nasopharyngeal Swab  Result Value Ref Range   SARS Coronavirus 2 NEGATIVE NEGATIVE    Comment: (NOTE) SARS-CoV-2 target nucleic acids are NOT DETECTED.  The SARS-CoV-2 RNA is generally detectable in upper and lower respiratory specimens during the acute phase of infection. The lowest concentration of SARS-CoV-2 viral copies this assay can detect is 250 copies / mL. A negative result does not preclude SARS-CoV-2 infection and should not be used as the sole basis for treatment or other patient management decisions.  A negative result may occur with improper specimen collection / handling, submission of specimen other than nasopharyngeal swab, presence of viral mutation(s) within the areas targeted by this assay, and inadequate number of viral copies (<  250 copies / mL). A negative result must be combined with clinical observations, patient history, and epidemiological information.  Fact Sheet for Patients:   StrictlyIdeas.no  Fact Sheet for Healthcare Providers: BankingDealers.co.za  This test is not yet approved or  cleared by the [place] FDA and has been authorized for detection and/or diagnosis of SARS-CoV-2 by FDA under an Emergency Use  Authorization (EUA).  This EUA will remain in effect (meaning this test can be used) for the duration of the COVID-19 declaration under Section 564(b)(1) of the Act, 21 U.S.C. section 360bbb-3(b)(1), unless the authorization is terminated or revoked sooner.  Performed at [facility], [place] [address] [place], [place] [address]   Blood gas, venous     Status: Abnormal   Collection Time: 02/19/20  8:50 PM  Result Value Ref Range   pH, Ven 7.272 7.250 - 7.430   pCO2, Ven 71.3 (HH) 44.0 - 60.0 mmHg    Comment: CRITICAL RESULT CALLED TO, READ BACK BY AND VERIFIED WITH: BROOKS,B  02/19/20 @2109  BY SEEL,M    pO2, Ven 39.1 32.0 - 45.0 mmHg   Bicarbonate 31.8 (H) 20.0 - 28.0 mmol/L   Acid-Base Excess 3.5 (H) 0.0 - 2.0 mmol/L   O2 Saturation 68.3 %   Patient temperature 98.6     Comment: Performed at [facility], [place] [address], [place], [place] [address]  Rapid urine drug screen (hospital performed)     Status: Abnormal   Collection Time: 02/19/20 11:25 PM  Result Value Ref Range   Opiates NONE DETECTED NONE DETECTED   Cocaine NONE DETECTED NONE DETECTED   Benzodiazepines POSITIVE (A) NONE DETECTED   Amphetamines NONE DETECTED NONE DETECTED   Tetrahydrocannabinol NONE DETECTED NONE DETECTED   Barbiturates NONE DETECTED NONE DETECTED    Comment: (NOTE) DRUG SCREEN FOR MEDICAL PURPOSES ONLY.  IF CONFIRMATION IS NEEDED FOR ANY PURPOSE, NOTIFY LAB WITHIN 5 DAYS.  LOWEST DETECTABLE LIMITS FOR URINE DRUG SCREEN Drug Class                     Cutoff (ng/mL) Amphetamine and metabolites    1000 Barbiturate and metabolites    200 Benzodiazepine                 [id number] Tricyclics and metabolites     300 Opiates and metabolites        300 Cocaine and metabolites        300 THC                            50 Performed at [facility] [place] [address] [place], [place] [address]   Urinalysis, Routine w reflex microscopic Urine, Clean  Catch     Status: None   Collection Time: 02/19/20 11:25 PM  Result Value Ref Range   Color, Urine YELLOW YELLOW   APPearance CLEAR CLEAR   Specific Gravity, Urine 1.013 1.005 - 1.030   pH 5.0 5.0 - 8.0   Glucose, UA NEGATIVE NEGATIVE mg/dL   Hgb urine dipstick NEGATIVE NEGATIVE   Bilirubin Urine NEGATIVE NEGATIVE   Ketones, ur NEGATIVE NEGATIVE mg/dL   Protein, ur NEGATIVE NEGATIVE mg/dL   Nitrite NEGATIVE NEGATIVE   Leukocytes,Ua NEGATIVE NEGATIVE    Comment: Performed at [facility], [place] [address] [place], [place] [address]  MRSA PCR Screening     Status: None   Collection Time: 02/20/20 12:00 AM  Specimen: Nasal Mucosa; Nasopharyngeal  Result Value Ref Range   MRSA by PCR NEGATIVE NEGATIVE    Comment:        The GeneXpert MRSA Assay (FDA approved for NASAL specimens only), is one component of a comprehensive MRSA colonization surveillance program. It is not intended to diagnose MRSA infection nor to guide or monitor treatment for MRSA infections. Performed at [facility], [place] [address], [place], [place] [address]   Troponin I (High Sensitivity)     Status: None   Collection Time: 02/20/20 12:33 AM  Result Value Ref Range   Troponin I (High Sensitivity) 12 <18 ng/L    Comment: (NOTE) Elevated high sensitivity troponin I (hsTnI) values and significant  changes across serial measurements may suggest ACS but many other  chronic and acute conditions are known to elevate hsTnI results.  Refer to the "Links" section for chest pain algorithms and additional  guidance. Performed at [facility], [place] [address], [place], [place] [address]   HIV Antibody (routine testing w rflx)     Status: None   Collection Time: 02/20/20 12:33 AM  Result Value Ref Range   HIV Screen 4th Generation wRfx Non Reactive Non Reactive    Comment: Performed at [facility], [place] [address], [place], [place] [address]  Magnesium      Status: None   Collection Time: 02/20/20 12:33 AM  Result Value Ref Range   Magnesium 2.3 1.7 - 2.4 mg/dL    Comment: Performed at [facility], [place] [address] [place], [place] [address]  Phosphorus     Status: None   Collection Time: 02/20/20 12:33 AM  Result Value Ref Range   Phosphorus 3.1 2.5 - 4.6 mg/dL    Comment: Performed at [facility], [place] [address] [place], [place] [address]  CBC WITH DIFFERENTIAL     Status: Abnormal   Collection Time: 02/20/20 12:33 AM  Result Value Ref Range   WBC 9.5 4.0 - 10.5 K/uL   RBC 3.86 (L) 3.87 - 5.11 MIL/uL   Hemoglobin 11.9 (L) 12.0 - 15.0 g/dL   HCT 39.0 36.0 - 46.0 %   MCV 101.0 (H) 80.0 - 100.0 fL   MCH 30.8 26.0 - 34.0 pg   MCHC 30.5 30.0 - 36.0 g/dL   RDW 12.9 11.5 - 15.5 %   Platelets 216 150 - 400 K/uL   nRBC 0.0 0.0 - 0.2 %   Neutrophils Relative % 66 %   Neutro Abs 6.2 1.7 - 7.7 K/uL   Lymphocytes Relative 23 %   Lymphs Abs 2.2 0.7 - 4.0 K/uL   Monocytes Relative 11 %   Monocytes Absolute 1.0 0.1 - 1.0 K/uL   Eosinophils Relative 0 %   Eosinophils Absolute 0.0 0.0 - 0.5 K/uL   Basophils Relative 0 %   Basophils Absolute 0.0 0.0 - 0.1 K/uL   Immature Granulocytes 0 %   Abs Immature Granulocytes 0.02 0.00 - 0.07 K/uL    Comment: Performed at [facility], [place] [address], [place], [place] [address]  TSH     Status: Abnormal   Collection Time: 02/20/20 12:33 AM  Result Value Ref Range   TSH 7.856 (H) 0.350 - 4.500 uIU/mL    Comment: Performed by a 3rd Generation assay with a functional sensitivity of <=0.01 uIU/mL. Performed at [facility], [place] [address] [place],  [address]   Comprehensive metabolic panel  Status: Abnormal   Collection Time: 02/20/20 12:33 AM  Result Value Ref Range   Sodium 137 135 - 145 mmol/L   Potassium 3.9 3.5 - 5.1 mmol/L   Chloride 99 98 - 111 mmol/L   CO2 29 22 - 32 mmol/L   Glucose, Bld 127 (H) 70 - 99  mg/dL    Comment: Glucose reference range applies only to samples taken after fasting for at least 8 hours.   BUN 20 8 - 23 mg/dL   Creatinine, Ser 1.30 (H) 0.44 - 1.00 mg/dL   Calcium 8.7 (L) 8.9 - 10.3 mg/dL   Total Protein 6.6 6.5 - 8.1 g/dL   Albumin 3.6 3.5 - 5.0 g/dL   AST 32 15 - 41 U/L   ALT 23 0 - 44 U/L   Alkaline Phosphatase 64 38 - 126 U/L   Total Bilirubin 0.5 0.3 - 1.2 mg/dL   GFR, Estimated 46 (L) >60 mL/min    Comment: (NOTE) Calculated using the CKD-EPI Creatinine Equation (2021)    Anion gap 9 5 - 15    Comment: Performed at [facility], [place] [address] [place], [place] [address]  T3     Status: None   Collection Time: 02/20/20  3:08 AM  Result Value Ref Range   T3, Total 104 71 - 180 ng/dL    Comment: (NOTE) Performed At: [facility] [place], [place] [id number] Rush Farmer MD [id number]   T4, free     Status: None   Collection Time: 02/20/20  3:08 AM  Result Value Ref Range   Free T4 0.68 0.61 - 1.12 ng/dL    Comment: (NOTE) Biotin ingestion may interfere with free T4 tests. If the results are inconsistent with the TSH level, previous test results, or the clinical presentation, then consider biotin interference. If needed, order repeat testing after stopping biotin. Performed at [facility], [place] [address] [place], [place] [address]   Comprehensive metabolic panel     Status: Abnormal   Collection Time: 02/21/20 10:26 AM  Result Value Ref Range   Sodium 138 135 - 145 mmol/L   Potassium 4.0 3.5 - 5.1 mmol/L   Chloride 102 98 - 111 mmol/L   CO2 30 22 - 32 mmol/L   Glucose, Bld 122 (H) 70 - 99 mg/dL    Comment: Glucose reference range applies only to samples taken after fasting for at least 8 hours.   BUN 13 8 - 23 mg/dL   Creatinine, Ser 0.89 0.44 - 1.00 mg/dL   Calcium 8.4 (L) 8.9 - 10.3 mg/dL   Total Protein 6.2 (L) 6.5 - 8.1 g/dL   Albumin 3.1 (L) 3.5 - 5.0 g/dL   AST 27 15 - 41 U/L    ALT 20 0 - 44 U/L   Alkaline Phosphatase 56 38 - 126 U/L   Total Bilirubin 0.5 0.3 - 1.2 mg/dL   GFR, Estimated >60 >60 mL/min    Comment: (NOTE) Calculated using the CKD-EPI Creatinine Equation (2021)    Anion gap 6 5 - 15    Comment: Performed at [facility], [place] [address] [place], [place] [address]    Imaging / Studies: DG Chest Portable 1 View  Result Date: 02/19/2020 CLINICAL DATA:  Shortness of breath, unresponsive EXAM: PORTABLE CHEST 1 VIEW COMPARISON:  None. FINDINGS: Cardiac shadow is enlarged but accentuated by the portable technique. Left mid lung atelectatic changes are noted. Some central vascular congestion is noted which  may be related to a poor inspiratory effort. No focal confluent infiltrate is seen. No bony abnormality is noted. IMPRESSION: Mild vascular congestion which may be accentuated by the poor inspiratory effort. Mild left mid lung atelectatic changes. Electronically Signed   By: Inez Catalina M.D.   On: 02/19/2020 17:46    Medications / Allergies: per chart  Antibiotics: Anti-infectives (From admission, onward)   None        Note: Portions of this report may have been transcribed using voice recognition software. Every effort was made to ensure accuracy; however, inadvertent computerized transcription errors may be present.   Any transcriptional errors that result from this process are unintentional.   Milus Height, MD [facility] Surgical Oncology, General Surgery, Trauma and [place] Surgery, [place] for weekday/non holidays Check [email or web address] for coverage night/weekend/holidays  Do not use SecureChat as it is not reliable for timely patient care.     (Please, do not use SecureChat as it is not reliable communication to operating surgeons for immediate patient care)      02/21/2020  11:29 AM

## 2020-02-21 NOTE — Progress Notes (Signed)
PT Cancellation Note  Patient Details Name: Lasya Vetter MRN: [id number] DOB: 01/01/1956   Cancelled Treatment:    Reason Eval/Treat Not Completed: Pain limiting ability to participate, daughter present, patient in bed. Reports that she was up to BR? BSC. Now waiting for xray of right hip and declined PT. Daughter from [place] here for only a few days. Patient may  benefit from SNF , Depending on progress.   Claretha Cooper 02/21/2020, 2:51 PM  Tresa Endo PT Acute Rehabilitation Services Pager [phone] Office [phone]

## 2020-02-21 NOTE — Progress Notes (Signed)
[facility] Progress Note  Patient: Jody Fitzgerald    [id number]  DOA: 02/19/2020     Date of Service: the patient was seen and examined on 02/21/2020  Brief hospital course: Past medical history of recent hernia repair, asthma, CKD 3B, nephrectomy, HTN, obesity.  Presents with complaints of unresponsive event at home after recent discharge from the hospital on the day of hernia surgery.  Thought to be secondary to opioids induced. Currently plan is treat constipation, pain as well as monitor for improvement in hypoxia.  Assessment and Plan: 1. acute hypoxic respiratory failure, mild hypercapnia. Secondary to opioid induced respiratory depression after hernia repair. Obesity hypoventilation syndrome undiagnosed On presentation saturation 89%. PCO2 more than 50. Treated with Narcan. Currently bilateral crackles on exam. Chest x-ray still unremarkable. Incentive spirometry recommended.  2. abdominal pain Constipation Poor p.o. intake Patient reports diffuse abdominal pain at the surgical site. Also reports right groin pain. X-ray unremarkable for any acute abnormality. Definitely increase gas burden. Continue bowel regimen.  Simethicone scheduled. Anticipating improvement in pain after resolution of constipation and gaseous distention.  3. right hip pain. No fall no trauma. X-ray unremarkable. Monitor.  4. AKI In the setting of poor p.o. intake. Currently IV fluids have corrected this renal function. Monitor.  5. leukocytosis No evidence of acute infection. Currently improving.  Monitor.  6. fatigue and tiredness PT OT consulted.  Will monitor for recommendation.  7. OSA/obesity hypoventilation syndrome Morbid obesity BMI significantly elevated. Patient has no diagnosis of OSA or obesity hypoventilation syndrome but based on her ABG suspect that she has this diagnosis. In the setting of narcotic pain medication certainly can result in respiratory  suppression. Body mass index is 40.71 kg/m.   Diet: Cardiac diet DVT Prophylaxis: Lovenox     Advance goals of care discussion: Full code  Family Communication: family was present at bedside, at the time of interview.  The pt provided permission to discuss medical plan with the family. Opportunity was given to ask question and all questions were answered satisfactorily.   Disposition:  Status is: Inpatient  Remains inpatient appropriate because:Altered mental status and IV treatments appropriate due to intensity of illness or inability to take PO   Dispo: The patient is from: Home              Anticipated d/c is to: Home              Anticipated d/c date is: 1 day              Patient currently is not medically stable to d/c.   Difficult to place patient No  Subjective: Reports severe abdominal pain.  No nausea or vomiting.  Inability to ambulate.  Not passing gas.  No BM since admission.  Minimal oral intake.  No chest pain.  No cough.  No fever.  Physical Exam:  General: Appear in moderate distress, no Rash; Oral Mucosa Clear, moist. no Abnormal Neck Mass Or lumps, Conjunctiva normal  Cardiovascular: S1 and S2 Present, no Murmur, Respiratory: good respiratory effort, Bilateral Air entry present and bilateral Crackles, no wheezes Abdomen: Bowel Sound present, Soft and mild tenderness Extremities: no Pedal edema Neurology: alert and oriented to time, place, and person affect appropriate. no new focal deficit Gait not checked due to patient safety concerns  Vitals:   02/20/20 1200 02/20/20 1226 02/20/20 2051 02/21/20 0515  BP: (!) 144/76 127/80 (!) 147/81 122/86  Pulse: 77 75 91 85  Resp: 15 16 20  18  Temp:  98.3 F (36.8 C) 98.8 F (37.1 C) 98.9 F (37.2 C)  TempSrc:  Oral Oral Oral  SpO2: 98% 98% 97% 97%  Weight:      Height:        Intake/Output Summary (Last 24 hours) at 02/21/2020 1258 Last data filed at 02/21/2020 3875 Gross per 24 hour  Intake 246.51 ml   Output -  Net 246.51 ml   Filed Weights   02/20/20 0000  Weight: 114.4 kg    Data Reviewed: I have personally reviewed and interpreted daily labs, tele strips, imaging. I reviewed all nursing notes, pharmacy notes, vitals, pertinent old records I have discussed plan of care as described above with RN and patient/family.  CBC: Recent Labs  Lab 02/18/20 2002 02/19/20 0535 02/19/20 1659 02/19/20 1705 02/20/20 0033  WBC 11.7* 9.5 12.6*  --  9.5  NEUTROABS  --   --  8.0*  --  6.2  HGB 13.4 12.5 12.8 14.3 11.9*  HCT 43.0 39.1 41.8 42.0 39.0  MCV 99.5 96.5 101.2*  --  101.0*  PLT 240 255 295  --  643   Basic Metabolic Panel: Recent Labs  Lab 02/19/20 0535 02/19/20 1659 02/19/20 1705 02/20/20 0033 02/21/20 1026  NA 136 134* 134* 137 138  K 4.4 4.0 4.0 3.9 4.0  CL 98 95* 96* 99 102  CO2 28 29  --  29 30  GLUCOSE 130* 200* 190* 127* 122*  BUN 12 21 21 20 13   CREATININE 0.99 1.35* 1.40* 1.30* 0.89  CALCIUM 8.8* 9.0  --  8.7* 8.4*  MG 2.0 2.3  --  2.3  --   PHOS  --   --   --  3.1  --     Studies: DG CHEST PORT 1 VIEW  Result Date: 02/21/2020 CLINICAL DATA:  Shortness of breath EXAM: PORTABLE CHEST 1 VIEW COMPARISON:  None. FINDINGS: Linear scarring/atelectasis in the lingula and right perihilar region. Mild patchy left lower lobe opacity, likely atelectasis, pneumonia not excluded. Mild right basilar atelectasis. No definite pleural effusions. No pneumothorax. The heart is top-normal in size. IMPRESSION: Mild patchy left lower lobe opacity, likely atelectasis, pneumonia not excluded. Electronically Signed   By: Julian Hy M.D.   On: 02/21/2020 11:37   DG Abd Portable 1V  Result Date: 02/21/2020 CLINICAL DATA:  Ventral hernia repair x3 days, right lower quadrant abdominal pain EXAM: PORTABLE ABDOMEN - 1 VIEW COMPARISON:  None. FINDINGS: Nonobstructive bowel gas pattern. Surgical clips in the left pelvis. Visualized osseous structures are within normal limits.  IMPRESSION: Negative. Electronically Signed   By: Julian Hy M.D.   On: 02/21/2020 11:38   DG HIP PORT UNILAT WITH PELVIS 1V RIGHT  Result Date: 02/21/2020 CLINICAL DATA:  Right hip pain EXAM: DG HIP (WITH OR WITHOUT PELVIS) 1V PORT RIGHT COMPARISON:  None. FINDINGS: No fracture or dislocation is seen. The joint spaces are preserved. IMPRESSION: Negative. Electronically Signed   By: Julian Hy M.D.   On: 02/21/2020 11:38    Scheduled Meds: . acetaminophen  500 mg Oral TID WC & HS  . amLODipine  5 mg Oral Daily  . Chlorhexidine Gluconate Cloth  6 each Topical Q0600  . enoxaparin (LOVENOX) injection  50 mg Subcutaneous Q24H  . lactulose  20 g Oral BID  . lip balm  1 application Topical BID  . polyethylene glycol  17 g Oral BID  . senna-docusate  1 tablet Oral BID  . simethicone  80 mg Oral  QID   Continuous Infusions: PRN Meds: albuterol, alum & mag hydroxide-simeth, bisacodyl, HYDROcodone-acetaminophen, magic mouthwash, menthol-cetylpyridinium, naLOXone (NARCAN)  injection, phenol  Time spent: 35 minutes  Author: Berle Mull, MD [facility] 02/21/2020 12:58 PM  To reach On-call, see care teams to locate the attending and reach out via [email or web address]. Between 7PM-7AM, please contact night-coverage If you still have difficulty reaching the attending provider, please page the [facility] (Director on Call) for [facility] on amion for assistance.

## 2020-02-22 MED ORDER — SIMETHICONE 80 MG PO CHEW: 80.0000 mg | CHEWABLE_TABLET | Freq: Four times a day (QID) | ORAL | 0 refills | Status: AC

## 2020-02-22 MED ORDER — DOCUSATE SODIUM 100 MG PO CAPS: 100.0000 mg | ORAL_CAPSULE | Freq: Two times a day (BID) | ORAL | 0 refills | Status: AC

## 2020-02-22 MED ORDER — POLYETHYLENE GLYCOL 3350 17 G PO PACK: 17.0000 g | PACK | Freq: Every day | ORAL | 0 refills | Status: AC

## 2020-02-22 MED ORDER — POLYETHYLENE GLYCOL 3350 17 G PO PACK
17.0000 g | PACK | Freq: Every day | ORAL | Status: DC
  Filled 2020-02-22: qty 1

## 2020-02-22 NOTE — Progress Notes (Signed)
Occupational Therapy Treatment Patient Details Name: Jody Fitzgerald MRN: [id number] DOB: 1955/05/12 Today's Date: 02/22/2020    History of present illness Jody Fitzgerald is a 65 y.o. female with medical history significant of obesity,  asthma, CKD stage III status post nephrectomy, HTN, prediabetes.Underwent elective repair of incisional flank abdominal wall hernia 02/18/20, no documented complications.  On 1/20, patient was discharged.  After discharge while awaiting ride, patient became suddenly unresponsive, hypoxic. Rushed to ER by rapid response. Narcan given with improved arousal.   OT comments  Patient progressing and showed improved ability to ambulate within room, 4' then 20' with need of Min guard assistance and encouragement due to pain, compared to previous session. Patient limited by incisional pain, fatigue and generalized weakness, along with deficits noted below. Pt continues to demonstrate good rehab potential and would benefit from continued skilled OT to increase safety and independence with ADLs and functional transfers to allow pt to return home safely and reduce caregiver burden and fall risk.   Follow Up Recommendations  Home health OT;SNF    Equipment Recommendations  Tub/shower seat    Recommendations for Other Services      Precautions / Restrictions Precautions Precautions: Fall Precaution Comments: monitor sats Restrictions Weight Bearing Restrictions: No       Mobility Bed Mobility Overal bed mobility: Needs Assistance Bed Mobility: Rolling;Sidelying to Sit;Sit to Supine Rolling: Min guard Sidelying to sit: Min guard;HOB elevated Supine to sit: Min guard Sit to supine: Supervision   General bed mobility comments: Pt uinsisted to back to supine without help. required increased time/effort, but able to bring BLEs onto bed gradually. Pt able to reach overhead and grasp headboard to assist with posterior scooting in supine with Min guard assist to block  ankles for pt leverage.  Transfers Overall transfer level: Needs assistance Equipment used: Rolling walker (2 wheeled) Transfers: Sit to/from Stand Sit to Stand: Min guard         General transfer comment: Pt ambulated in room with RW ~4 steps to recliner for grooming, then ~20' around bed to return to EOB. Pt needs encouragement to mobilize.    Balance Overall balance assessment: Needs assistance Sitting-balance support: No upper extremity supported Sitting balance-Leahy Scale: Fair     Standing balance support: Bilateral upper extremity supported Standing balance-Leahy Scale: Poor                             ADL either performed or assessed with clinical judgement   ADL Overall ADL's : Needs assistance/impaired Eating/Feeding: Independent   Grooming: Oral care Grooming Details (indicate cue type and reason): Pt declined standing at sink. Performed grooming seated in recliner. Pt declined further grooming activities stating that she recevied a full bed bath earlier today.         Upper Body Dressing : Set up;Sitting Upper Body Dressing Details (indicate cue type and reason): To don posterior Personal assistant Details (indicate cue type and reason): Pt denied need to void. Please see Mobility section.         Functional mobility during ADLs: Min guard;Rolling walker       Vision Patient Visual Report: No change from baseline     Perception     Praxis      Cognition Arousal/Alertness: Awake/alert Behavior During Therapy: WFL for tasks assessed/performed Overall Cognitive Status: Within Functional Limits for tasks assessed  General Comments: interpreter [id number] used on Ipad.        Exercises     Shoulder Instructions       General Comments      Pertinent Vitals/ Pain       Pain Assessment: Faces Faces Pain Scale: Hurts little more Pain Location: abdomen/incision Pain  Descriptors / Indicators: Discomfort;Guarding Pain Intervention(s): Limited activity within patient's tolerance;Monitored during session;Repositioned  Home Living                                          Prior Functioning/Environment              Frequency  Min 2X/week        Progress Toward Goals  OT Goals(current goals can now be found in the care plan section)  Progress towards OT goals: Progressing toward goals  Acute Rehab OT Goals Patient Stated Goal: to go home  Plan Discharge plan remains appropriate    Co-evaluation                 AM-PAC OT "6 Clicks" Daily Activity     Outcome Measure   Help from another person eating meals?: None Help from another person taking care of personal grooming?: A Little Help from another person toileting, which includes using toliet, bedpan, or urinal?: A Little Help from another person bathing (including washing, rinsing, drying)?: A Lot Help from another person to put on and taking off regular upper body clothing?: A Little Help from another person to put on and taking off regular lower body clothing?: A Lot 6 Click Score: 17    End of Session Equipment Utilized During Treatment: Rolling walker;Gait belt  OT Visit Diagnosis: Unsteadiness on feet (R26.81);Pain Pain - part of body:  (Abdominal incision)   Activity Tolerance Patient tolerated treatment well   Patient Left in chair   Nurse Communication Mobility status        Time: 2683-4196 OT Time Calculation (min): 23 min  Charges: OT General Charges $OT Visit: 1 Visit OT Treatments $Self Care/Home Management : 8-22 mins $Therapeutic Activity: 8-22 mins  Anderson Malta, [place] Office: [phone] 02/22/2020   Julien Girt 02/22/2020, 1:10 PM

## 2020-02-22 NOTE — Progress Notes (Signed)
Neika Friedrichs DO:[id number] 1955-12-12  CARE TEAM:  PCP: Patient, No Pcp Per  Outpatient Care Team: Patient Care Team: Patient, No Pcp Per as PCP - General (General Practice) Astrid Divine, MD as Referring Physician (Cardiology) Michael Boston, MD as Consulting Physician (General Surgery) Koleen Distance, MD as Referring Physician (Gastroenterology)  Inpatient Treatment Team: Treatment Team: Attending Provider: Lavina Hamman, MD; Rounding Team: Fatima Blank, MD; Consulting Physician: Michael Boston, MD; Occupational Therapist: Julien Girt, OT; Registered Nurse: Willene Hatchet, RN; Technician: Dot Lanes, NT; Technician: Cherlyn Labella, NT; Registered Nurse: Delford Field, RN; Utilization Review: Conception Oms, RN; Technician: Ladell Heads, NT   Problem List:   Principal Problem:   Acute respiratory failure with hypoxia and hypercapnia [facility]) Active Problems:   History of right nephrectomy   Obesity (BMI 30-39.9)   Incisional hernia of right flank s/p lap repair w mesh 02/18/2020   Opioid overdose (HCC)   OSA (obstructive sleep apnea)   CKD (chronic kidney disease) stage 3, GFR 30-59 ml/min (HCC)   Asthma   Hypertension   Pre-diabetes   Hepatic steatosis   Obesity hypoventilation syndrome (HCC)   Hyperglycemia   AKI (acute kidney injury) ([place])   Acute blood loss anemia   Elevated TSH   Acute respiratory failure ([place])  SURGERY 02/18/2020  POST-OPERATIVE DIAGNOSIS:  INCISIONAL FLANK ABDOMINAL WALL HERNIA  PROCEDURE:   LAPAROSCOPIC REPAIR OF  INCISIONAL RIGHT FLANK ABDOMINAL WALL HERNIA WITH MESH  TAP BLOCK - BILATERAL  SURGEON:  Adin Hector, MD    Assessment  OK  [facility] Stay = 2 days)  Plan:  - Looks great.   From surgical standpoint, tolerating diet with bowel function and has pain controlled with minimal medications.    Ok from surgical standpoint for d/c, but ultimate decision up to medicine.    -VTE  prophylaxis- SCDs, etc -mobilize as tolerated to help recovery  Disposition:  Disposition:  The patient is from: Home  Anticipate discharge to:  Home  Anticipated Date of Discharge is:  February 22, 2020  Barriers to discharge:  Pending Clinical improvement (more likely than not      02/22/2020    Subjective: (Chief complaint)  Patient apparently became sleepy and unresponsive as she was leaving the hospital 02/18/2020.  Sent to the ER.  Given Narcan and improved.  Suspected hypoventilation syndrome.  Pt evaluated with video interpreter (spanish).  Had BM and flatus.  Tolerated soft diet.  Got oral hydrocodone and robaxin yesterday (minimal number of pills) with good results.    Objective:  Vital signs:  Vitals:   02/21/20 2047 02/21/20 2309 02/21/20 2320 02/22/20 0540  BP: (!) 189/87  (!) 143/75 (!) 143/87  Pulse: 90  92 88  Resp: 20   18  Temp: (!) 100.7 F (38.2 C)   98.6 F (37 C)  TempSrc: Oral   Oral  SpO2: 92% 99%  93%  Weight:      Height:        Last BM Date: 02/21/20  Intake/Output   Yesterday:  No intake/output data recorded. This shift:  No intake/output data recorded.  Bowel function:  Flatus: YES  BM:  No  Drain: (No drain)   Physical Exam:  General: Pt awake/alert in no acute distress.  Alert.  Not toxic/sickly Psych:  No delerium/psychosis/paranoia.  Oriented x 4 Chest: breathing comfortably CV:  Pulses intact.  Regular rhythm.  No major extremity edema Abdomen: Soft.  Non distended.   Mildly tender  at incisions only.  No evidence of peritonitis.   Ext:  No deformity.  No mjr edema.  No cyanosis Skin: No petechiae / purpurea.  No major sores.  Warm and dry    Results:   Cultures: Recent Results (from the past 720 hour(s))  SARS CORONAVIRUS 2 (TAT 6-24 HRS) Nasopharyngeal Nasopharyngeal Swab     Status: None   Collection Time: 02/14/20  1:55 PM   Specimen: Nasopharyngeal Swab  Result Value Ref Range Status   SARS Coronavirus  2 NEGATIVE NEGATIVE Final    Comment: (NOTE) SARS-CoV-2 target nucleic acids are NOT DETECTED.  The SARS-CoV-2 RNA is generally detectable in upper and lower respiratory specimens during the acute phase of infection. Negative results do not preclude SARS-CoV-2 infection, do not rule out co-infections with other pathogens, and should not be used as the sole basis for treatment or other patient management decisions. Negative results must be combined with clinical observations, patient history, and epidemiological information. The expected result is Negative.  Fact Sheet for Patients: SugarRoll.be  Fact Sheet for Healthcare Providers: https://www.woods-mathews.com/  This test is not yet approved or cleared by the [place] FDA and  has been authorized for detection and/or diagnosis of SARS-CoV-2 by FDA under an Emergency Use Authorization (EUA). This EUA will remain  in effect (meaning this test can be used) for the duration of the COVID-19 declaration under Se ction 564(b)(1) of the Act, 21 U.S.C. section 360bbb-3(b)(1), unless the authorization is terminated or revoked sooner.  Performed at [facility], [place] [address] [place], [place] [address]   SARS Coronavirus 2 by RT PCR (hospital order, performed in [facility] hospital lab) Nasopharyngeal Nasopharyngeal Swab     Status: None   Collection Time: 02/19/20  5:08 PM   Specimen: Nasopharyngeal Swab  Result Value Ref Range Status   SARS Coronavirus 2 NEGATIVE NEGATIVE Final    Comment: (NOTE) SARS-CoV-2 target nucleic acids are NOT DETECTED.  The SARS-CoV-2 RNA is generally detectable in upper and lower respiratory specimens during the acute phase of infection. The lowest concentration of SARS-CoV-2 viral copies this assay can detect is 250 copies / mL. A negative result does not preclude SARS-CoV-2 infection and should not be used as the sole basis for treatment or  other patient management decisions.  A negative result may occur with improper specimen collection / handling, submission of specimen other than nasopharyngeal swab, presence of viral mutation(s) within the areas targeted by this assay, and inadequate number of viral copies (<250 copies / mL). A negative result must be combined with clinical observations, patient history, and epidemiological information.  Fact Sheet for Patients:   StrictlyIdeas.no  Fact Sheet for Healthcare Providers: BankingDealers.co.za  This test is not yet approved or  cleared by the [place] FDA and has been authorized for detection and/or diagnosis of SARS-CoV-2 by FDA under an Emergency Use Authorization (EUA).  This EUA will remain in effect (meaning this test can be used) for the duration of the COVID-19 declaration under Section 564(b)(1) of the Act, 21 U.S.C. section 360bbb-3(b)(1), unless the authorization is terminated or revoked sooner.  Performed at [facility], [place] [address], [place], [place] [address]   MRSA PCR Screening     Status: None   Collection Time: 02/20/20 12:00 AM   Specimen: Nasal Mucosa; Nasopharyngeal  Result Value Ref Range Status   MRSA by PCR NEGATIVE NEGATIVE Final    Comment:        The GeneXpert  MRSA Assay (FDA approved for NASAL specimens only), is one component of a comprehensive MRSA colonization surveillance program. It is not intended to diagnose MRSA infection nor to guide or monitor treatment for MRSA infections. Performed at [facility] [place] [address], [place], [place] [address]     Labs: Results for orders placed or performed during the hospital encounter of 02/19/20 (from the past 48 hour(s))  Comprehensive metabolic panel     Status: Abnormal   Collection Time: 02/21/20 10:26 AM  Result Value Ref Range   Sodium 138 135 - 145 mmol/L   Potassium 4.0 3.5 - 5.1  mmol/L   Chloride 102 98 - 111 mmol/L   CO2 30 22 - 32 mmol/L   Glucose, Bld 122 (H) 70 - 99 mg/dL    Comment: Glucose reference range applies only to samples taken after fasting for at least 8 hours.   BUN 13 8 - 23 mg/dL   Creatinine, Ser 0.89 0.44 - 1.00 mg/dL   Calcium 8.4 (L) 8.9 - 10.3 mg/dL   Total Protein 6.2 (L) 6.5 - 8.1 g/dL   Albumin 3.1 (L) 3.5 - 5.0 g/dL   AST 27 15 - 41 U/L   ALT 20 0 - 44 U/L   Alkaline Phosphatase 56 38 - 126 U/L   Total Bilirubin 0.5 0.3 - 1.2 mg/dL   GFR, Estimated >60 >60 mL/min    Comment: (NOTE) Calculated using the CKD-EPI Creatinine Equation (2021)    Anion gap 6 5 - 15    Comment: Performed at [facility], [place] [address], [place], [place] [address]    Imaging / Studies: DG CHEST PORT 1 VIEW  Result Date: 02/21/2020 CLINICAL DATA:  Shortness of breath EXAM: PORTABLE CHEST 1 VIEW COMPARISON:  None. FINDINGS: Linear scarring/atelectasis in the lingula and right perihilar region. Mild patchy left lower lobe opacity, likely atelectasis, pneumonia not excluded. Mild right basilar atelectasis. No definite pleural effusions. No pneumothorax. The heart is top-normal in size. IMPRESSION: Mild patchy left lower lobe opacity, likely atelectasis, pneumonia not excluded. Electronically Signed   By: Julian Hy M.D.   On: 02/21/2020 11:37   DG Abd Portable 1V  Result Date: 02/21/2020 CLINICAL DATA:  Ventral hernia repair x3 days, right lower quadrant abdominal pain EXAM: PORTABLE ABDOMEN - 1 VIEW COMPARISON:  None. FINDINGS: Nonobstructive bowel gas pattern. Surgical clips in the left pelvis. Visualized osseous structures are within normal limits. IMPRESSION: Negative. Electronically Signed   By: Julian Hy M.D.   On: 02/21/2020 11:38   DG HIP PORT UNILAT WITH PELVIS 1V RIGHT  Result Date: 02/21/2020 CLINICAL DATA:  Right hip pain EXAM: DG HIP (WITH OR WITHOUT PELVIS) 1V PORT RIGHT COMPARISON:  None. FINDINGS: No fracture or  dislocation is seen. The joint spaces are preserved. IMPRESSION: Negative. Electronically Signed   By: Julian Hy M.D.   On: 02/21/2020 11:38    Medications / Allergies: per chart  Antibiotics: Anti-infectives (From admission, onward)   None        Note: Portions of this report may have been transcribed using voice recognition software. Every effort was made to ensure accuracy; however, inadvertent computerized transcription errors may be present.   Any transcriptional errors that result from this process are unintentional.   Milus Height, MD [facility], General Surgery, Trauma and [facility], [place] for weekday/non holidays Check [email or web address] for coverage night/weekend/holidays  Do not use SecureChat as it is not reliable  for timely patient care.     (Please, do not use SecureChat as it is not reliable communication to operating surgeons for immediate patient care)      02/22/2020  8:30 AM

## 2020-02-22 NOTE — Progress Notes (Signed)
A Spanish version of the AVS was given to the patient and the iPAD Interpreter services was used to review and answer all questions the patient voiced.  She was taken to the main lobby and turned over to the care of her two daughters at which time she was alert, oriented and skin warm and dry.  Handoff occurred with no untoward events.

## 2020-02-22 NOTE — Progress Notes (Signed)
Communicated with pt using interpreter [phone] pt found trying to get out of bed, educated not to attempt to get out of bed alone, advised of risk for falls. Pt verbalized understanding. Pt assisted to chair from bed. Tolerated transfer well. Denied any concerns, no distress noted. Alarm placed on chair and will be placed on bed upon returning to bed. Will continue to monitor pt. Closely. Neomia Dear RN

## 2020-02-22 NOTE — Progress Notes (Signed)
Communicated with patient using interpreter # [id number]. Instructed pt. How to use IS and TCDB, pt. verbalized understanding. Discuss need for increase use of IS, lungs diminished with expiratory wheezing, RT notified- req'ed albuterol neb. No distress noted, will continue to monitor pt. Closely. Neomia Dear RN

## 2020-02-22 NOTE — Progress Notes (Signed)
Addendum to previous note: Communicated with pt using interpreter # [phone], patient refused tylenol and pain medication. States she only has one kidney and taking drugs can affect her kidney. States she is okay and does not need pain medication. Patient with facial grimancing and appears to be uncomfortable. Educated on the important of pain management. Patient refused all medications. Will continue to monitor and educate patient on benefits of pain management, use of IS and TCDB. Neomia Dear, RN

## 2020-02-22 NOTE — TOC Initial Note (Addendum)
Transition of Care [facility]) - Initial/Assessment Note    Patient Details  Name: Jody Fitzgerald MRN: [id number] Date of Birth: 09-Jun-1955  Transition of Care (TOC) CM/SW Contact:    Joaquin Courts, RN Phone Number: 02/22/2020, 11:53 AM  Clinical Narrative:                 [name] to provide 3in1 and RW for home use.  [facility] for HHPT/OT services.  [place] information placed on AVS.  Expected Discharge Plan: [place] Barriers to Discharge: No Barriers Identified   Patient Goals and CMS Choice Patient states their goals for this hospitalization and ongoing recovery are:: to go home CMS Medicare.gov Compare Post Acute Care list provided to:: Patient Choice offered to / list presented to : Patient  Expected Discharge Plan and Services Expected Discharge Plan: [place]   Discharge Planning Services: CM Consult Post Acute Care Choice: [place] arrangements for the past 2 months: Single Family Home Expected Discharge Date: 02/22/20               DME Arranged: 3-N-1,[place] rolling DME Agency: Other - Comment [name]) Date DME Agency Contacted: 02/22/20 Time DME Agency Contacted: 67 Representative spoke with at DME Agency: [place]: PT,OT [place] Agency: [place] Date [place]: 02/22/20 Time [place]: 46 Representative spoke with at [place]: Tommi Rumps  Prior Living Arrangements/Services Living arrangements for the past 2 months: [place]   Patient language and need for interpreter reviewed:: Yes Do you feel safe going back to the place where you live?: Yes      Need for Family Participation in Patient Care: Yes (Comment) Care giver support system in place?: Yes (comment)   Criminal Activity/Legal Involvement Pertinent to Current Situation/Hospitalization: No - Comment as needed  Activities of Daily Living Home Assistive Devices/Equipment: Eyeglasses,Dentures (specify type)  (upper and lower dentures) ADL Screening (condition at time of admission) Patient's cognitive ability adequate to safely complete daily activities?: Yes Is the patient deaf or have difficulty hearing?: No Does the patient have difficulty seeing, even when wearing glasses/contacts?: No Does the patient have difficulty concentrating, remembering, or making decisions?: No Patient able to express need for assistance with ADLs?: Yes Does the patient have difficulty dressing or bathing?: No Independently performs ADLs?: Yes (appropriate for developmental age) Does the patient have difficulty walking or climbing stairs?: No Weakness of Legs: Both Weakness of Arms/Hands: None  Permission Sought/Granted                  Emotional Assessment Appearance:: Appears stated age         Psych Involvement: No (comment)  Admission diagnosis:  Acute respiratory failure ([employer]) [J96.00] Acute respiratory failure with hypoxia ([place]) [J96.01] Opiate overdose, accidental or unintentional, initial encounter [facility]) [T40.601A] Patient Active Problem List   Diagnosis Date Noted  . OSA (obstructive sleep apnea) 02/20/2020  . Hepatic steatosis 02/20/2020  . Obesity hypoventilation syndrome ([place]) 02/20/2020  . Hyperglycemia 02/20/2020  . AKI (acute kidney injury) ([place]) 02/20/2020  . Acute blood loss anemia 02/20/2020  . Elevated TSH 02/20/2020  . Acute respiratory failure ([place]) 02/20/2020  . CKD (chronic kidney disease) stage 3, GFR 30-59 ml/min (HCC)   . Asthma   . Hypertension   . Pre-diabetes   . Opioid overdose ([place]) 02/19/2020  . Acute respiratory failure with hypoxia and hypercapnia ([place]) 02/19/2020  . Incisional hernia of right flank s/p lap repair w  mesh 02/18/2020 02/18/2020  . Uses Spanish as primary spoken language 11/24/2019  . 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 History of right nephrectomy 11/24/2019  . Obesity (BMI 30-39.9) 11/24/2019   PCP:  Patient, No Pcp  Per Pharmacy:   [facility] [place], [place] [address] Phone: [phone] Fax: [phone]     Social Determinants of Health (SDOH) Interventions    Readmission Risk Interventions No flowsheet data found.

## 2020-02-24 NOTE — Discharge Summary (Signed)
[facility] Discharge Summary   Patient: Jody Fitzgerald [id number]  PCP: Patient, No Pcp Per  Date of admission: 02/19/2020   Date of discharge: 02/22/2020      Discharge Diagnoses:  Principal Problem:   Acute respiratory failure with hypoxia and hypercapnia ([place]) Active Problems:   History of right nephrectomy   Obesity (BMI 30-39.9)   Incisional hernia of right flank s/p lap repair w mesh 02/18/2020   Opioid overdose (HCC)   OSA (obstructive sleep apnea)   CKD (chronic kidney disease) stage 3, GFR 30-59 ml/min (HCC)   Asthma   Hypertension   Pre-diabetes   Hepatic steatosis   Obesity hypoventilation syndrome (HCC)   Hyperglycemia   AKI (acute kidney injury) ([place])   Acute blood loss anemia   Elevated TSH   Acute respiratory failure ([place])   Admitted From: home Disposition:  Home with home health, pt refused SNF  Recommendations for Outpatient Follow-up:  1. PCP: follow up ith PCP in 1 week and General surgery as recommended 2. Follow up LABS/TEST:  none   Follow-up Information    Michael Boston, MD. Schedule an appointment as soon as possible for a visit in 3 week(s).   Specialty: General Surgery Contact information: [address] [place] [address] [phone]        PCP. Schedule an appointment as soon as possible for a visit in 1 week(s).        [facility] Follow up.   Specialty: Home Health Services Why: agency will provide home health therapy Contact information: [address] [place] [address] [phone]        [facility] Follow up.   Why: please call monday morning to schedule a follow up appointment and establish primary care. Contact information: [address] [place]  [phone]             Discharge Instructions    Ambulatory referral to Sleep Studies   Complete by: As directed    Diet - low sodium heart healthy    Complete by: As directed    Increase activity slowly   Complete by: As directed    Leave dressing on - Keep it clean, dry, and intact until clinic visit   Complete by: As directed       Diet recommendation: Cardiac diet  Activity: The patient is advised to gradually reintroduce usual activities, as tolerated  Discharge Condition: stable  Code Status: Full code   History of present illness: As per the H and P dictated on admission, "Jody Fitzgerald is a 65 y.o. female with medical history significant of obesity recent hernia repair, asthma, CKD stage III status post nephrectomy, HTN, prediabetes    Presented with found unresponsive pale and diaphoretic.  Minimal response to painful stimuli in the hospital lobby with O2 sats down to 30% patient was bagged and prepared for intubation.  CBG was 178 intranasal Narcan was administered at 4:45 PM initiated Response after IV was obtained 2 mg of IV Narcan was administered at 1700 and patient became responsive.  Was able to communicate clearly with the family and physician requesting pain medication. Of note: Patient was admitted on 19 January for hernia repair.  Down by Dr. Johney Maine.  Tolerated procedure well was able to mobilize postoperatively and discharged the next day on the 20th. Hip pain was treated with Roxicodone as well as oxycodone 5-10 every 4 as needed as well as  fentanyl Patient received last dose of oxycodone 10 mg at 1547 on 20 January Her last dose of fentanyl was on 19 January at 1740 3 PM 25 mcg of given at that time  Patient states at her baseline she only takes Tylenol for pain"  Hospital Course:  Summary of her active problems in the hospital is as following. 1. acute hypoxic respiratory failure, mild hypercapnia. Secondary to opioid induced respiratory depression after hernia repair. Obesity hypoventilation syndrome undiagnosed On presentation saturation 89%. PCO2 more than 50. Treated with Narcan. Currently  bilateral crackles on exam. Chest x-ray still unremarkable. Incentive spirometry recommended.  2. abdominal pain Constipation Poor p.o. intake Patient reports diffuse abdominal pain at the surgical site. Also reports right groin pain. X-ray unremarkable for any acute abnormality. Definitely increase gas burden. Continue bowel regimen.  Simethicone scheduled. improvement in pain after resolution of constipation and gaseous distention.  3. right hip pain. No fall no trauma. X-ray unremarkable. Monitor.  4. AKI In the setting of poor p.o. intake. IV fluids have corrected this renal function.  5. leukocytosis No evidence of acute infection. Currently improving.  Monitor.  6. fatigue and tiredness PT OT consulted.  recommendation was for SNF or home with 24 hour supervision. Pt preferred to go home.   7. OSA/obesity hypoventilation syndrome Morbid obesity BMI significantly elevated. Patient has no diagnosis of OSA or obesity hypoventilation syndrome but based on her ABG suspect that she has this diagnosis. In the setting of narcotic pain medication certainly can result in respiratory suppression. Recommended outpatient sleep study.   Body mass index is 40.71 kg/m.   Pain control  - [employer] Controlled Substance Reporting System database was reviewed. - pt already has prescription from prior admission. - Patient was instructed, not to drive, operate heavy machinery, perform activities at heights, swimming or participation in water activities or provide baby sitting services while on Pain, Sleep and Anxiety Medications; until her outpatient Physician has advised to do so again.  - Also recommended to not to take more than prescribed Pain, Sleep and Anxiety Medications.  Patient was seen by physical therapy, who recommended SNF, pt wanted to go home, home health was arranged. On the day of the discharge the patient's vitals were stable, and no other acute medical  condition were reported by patient. The patient was felt safe to be discharge at Home with Home health.  Consultants: General surgery  Procedures: none  DISCHARGE MEDICATION: Allergies as of 02/22/2020      Reactions   Nsaids    Avoid NSAIDS related to nephrectomy.   Oxycodone Other (See Comments)   oversedated      Medication List    STOP taking these medications   lisinopril-hydrochlorothiazide 20-25 MG tablet Commonly known as: ZESTORETIC     TAKE these medications   amLODipine 5 MG tablet Commonly known as: NORVASC Take 5 mg by mouth daily.   docusate sodium 100 MG capsule Commonly known as: Colace Take 1 capsule (100 mg total) by mouth 2 (two) times daily for 15 days.   methocarbamol 750 MG tablet Commonly known as: ROBAXIN Take 1 tablet (750 mg total) by mouth 4 (four) times daily as needed (use for muscle cramps/pain). What changed: reasons to take this   polyethylene glycol 17 g packet Commonly known as: MIRALAX / GLYCOLAX Take 17 g by mouth daily.   simethicone 80 MG chewable tablet Commonly known as: MYLICON Chew 1 tablet (80 mg total) by mouth 4 (four) times daily.  Discharge Care Instructions  (From admission, onward)         Start     Ordered   02/22/20 0000  Leave dressing on - Keep it clean, dry, and intact until clinic visit        02/22/20 1138          Discharge Exam: Filed Weights   02/20/20 0000  Weight: 114.4 kg   Vitals:   02/22/20 0540 02/22/20 1432  BP: (!) 143/87 136/65  Pulse: 88 86  Resp: 18 18  Temp: 98.6 F (37 C) 98.2 F (36.8 C)  SpO2: 93% 95%   General: Appear in no distress, no Rash; Oral Mucosa Clear, moist. no Abnormal Neck Mass Or lumps, Conjunctiva normal  Cardiovascular: S1 and S2 Present, no Murmur Respiratory: good respiratory effort, Bilateral Air entry present and fain basal Crackles, no wheezes Abdomen: Bowel Sound present, Soft and no tenderness Extremities: no Pedal edema Neurology:  alert and oriented to time, place, and person affect appropriate. no new focal deficit  The results of significant diagnostics from this hospitalization (including imaging, microbiology, ancillary and laboratory) are listed below for reference.    Significant Diagnostic Studies: DG CHEST PORT 1 VIEW  Result Date: 02/21/2020 CLINICAL DATA:  Shortness of breath EXAM: PORTABLE CHEST 1 VIEW COMPARISON:  None. FINDINGS: Linear scarring/atelectasis in the lingula and right perihilar region. Mild patchy left lower lobe opacity, likely atelectasis, pneumonia not excluded. Mild right basilar atelectasis. No definite pleural effusions. No pneumothorax. The heart is top-normal in size. IMPRESSION: Mild patchy left lower lobe opacity, likely atelectasis, pneumonia not excluded. Electronically Signed   By: Julian Hy M.D.   On: 02/21/2020 11:37   DG Chest Portable 1 View  Result Date: 02/19/2020 CLINICAL DATA:  Shortness of breath, unresponsive EXAM: PORTABLE CHEST 1 VIEW COMPARISON:  None. FINDINGS: Cardiac shadow is enlarged but accentuated by the portable technique. Left mid lung atelectatic changes are noted. Some central vascular congestion is noted which may be related to a poor inspiratory effort. No focal confluent infiltrate is seen. No bony abnormality is noted. IMPRESSION: Mild vascular congestion which may be accentuated by the poor inspiratory effort. Mild left mid lung atelectatic changes. Electronically Signed   By: Inez Catalina M.D.   On: 02/19/2020 17:46   DG Abd Portable 1V  Result Date: 02/21/2020 CLINICAL DATA:  Ventral hernia repair x3 days, right lower quadrant abdominal pain EXAM: PORTABLE ABDOMEN - 1 VIEW COMPARISON:  None. FINDINGS: Nonobstructive bowel gas pattern. Surgical clips in the left pelvis. Visualized osseous structures are within normal limits. IMPRESSION: Negative. Electronically Signed   By: Julian Hy M.D.   On: 02/21/2020 11:38   DG HIP PORT UNILAT WITH  PELVIS 1V RIGHT  Result Date: 02/21/2020 CLINICAL DATA:  Right hip pain EXAM: DG HIP (WITH OR WITHOUT PELVIS) 1V PORT RIGHT COMPARISON:  None. FINDINGS: No fracture or dislocation is seen. The joint spaces are preserved. IMPRESSION: Negative. Electronically Signed   By: Julian Hy M.D.   On: 02/21/2020 11:38    Microbiology: Recent Results (from the past 240 hour(s))  SARS CORONAVIRUS 2 (TAT 6-24 HRS) Nasopharyngeal Nasopharyngeal Swab     Status: None   Collection Time: 02/14/20  1:55 PM   Specimen: Nasopharyngeal Swab  Result Value Ref Range Status   SARS Coronavirus 2 NEGATIVE NEGATIVE Final    Comment: (NOTE) SARS-CoV-2 target nucleic acids are NOT DETECTED.  The SARS-CoV-2 RNA is generally detectable in upper and lower respiratory specimens during the  acute phase of infection. Negative results do not preclude SARS-CoV-2 infection, do not rule out co-infections with other pathogens, and should not be used as the sole basis for treatment or other patient management decisions. Negative results must be combined with clinical observations, patient history, and epidemiological information. The expected result is Negative.  Fact Sheet for Patients: HairSlick.no  Fact Sheet for Healthcare Providers: quierodirigir.com  This test is not yet approved or cleared by the [place] FDA and  has been authorized for detection and/or diagnosis of SARS-CoV-2 by FDA under an Emergency Use Authorization (EUA). This EUA will remain  in effect (meaning this test can be used) for the duration of the COVID-19 declaration under Se ction 564(b)(1) of the Act, 21 U.S.C. section 360bbb-3(b)(1), unless the authorization is terminated or revoked sooner.  Performed at [facility], [address] [place], [place] [address]   SARS Coronavirus 2 by RT PCR (hospital order, performed in [facility] hospital lab) Nasopharyngeal  Nasopharyngeal Swab     Status: None   Collection Time: 02/19/20  5:08 PM   Specimen: Nasopharyngeal Swab  Result Value Ref Range Status   SARS Coronavirus 2 NEGATIVE NEGATIVE Final    Comment: (NOTE) SARS-CoV-2 target nucleic acids are NOT DETECTED.  The SARS-CoV-2 RNA is generally detectable in upper and lower respiratory specimens during the acute phase of infection. The lowest concentration of SARS-CoV-2 viral copies this assay can detect is 250 copies / mL. A negative result does not preclude SARS-CoV-2 infection and should not be used as the sole basis for treatment or other patient management decisions.  A negative result may occur with improper specimen collection / handling, submission of specimen other than nasopharyngeal swab, presence of viral mutation(s) within the areas targeted by this assay, and inadequate number of viral copies (<250 copies / mL). A negative result must be combined with clinical observations, patient history, and epidemiological information.  Fact Sheet for Patients:   BoilerBrush.com.cy  Fact Sheet for Healthcare Providers: https://pope.com/  This test is not yet approved or  cleared by the [place] FDA and has been authorized for detection and/or diagnosis of SARS-CoV-2 by FDA under an Emergency Use Authorization (EUA).  This EUA will remain in effect (meaning this test can be used) for the duration of the COVID-19 declaration under Section 564(b)(1) of the Act, 21 U.S.C. section 360bbb-3(b)(1), unless the authorization is terminated or revoked sooner.  Performed at [facility], [address], [place], [place] [address]   MRSA PCR Screening     Status: None   Collection Time: 02/20/20 12:00 AM   Specimen: Nasal Mucosa; Nasopharyngeal  Result Value Ref Range Status   MRSA by PCR NEGATIVE NEGATIVE Final    Comment:        The GeneXpert MRSA Assay (FDA approved for  NASAL specimens only), is one component of a comprehensive MRSA colonization surveillance program. It is not intended to diagnose MRSA infection nor to guide or monitor treatment for MRSA infections. Performed at [facility], [address] [place], [place] [address]      Labs: CBC: Recent Labs  Lab 02/18/20 2002 02/19/20 0535 02/19/20 1659 02/19/20 1705 02/20/20 0033  WBC 11.7* 9.5 12.6*  --  9.5  NEUTROABS  --   --  8.0*  --  6.2  HGB 13.4 12.5 12.8 14.3 11.9*  HCT 43.0 39.1 41.8 42.0 39.0  MCV 99.5 96.5 101.2*  --  101.0*  PLT 240 255  295  --  235   Basic Metabolic Panel: Recent Labs  Lab 02/19/20 0535 02/19/20 1659 02/19/20 1705 02/20/20 0033 02/21/20 1026  NA 136 134* 134* 137 138  K 4.4 4.0 4.0 3.9 4.0  CL 98 95* 96* 99 102  CO2 28 29  --  29 30  GLUCOSE 130* 200* 190* 127* 122*  BUN 12 21 21 20 13   CREATININE 0.99 1.35* 1.40* 1.30* 0.89  CALCIUM 8.8* 9.0  --  8.7* 8.4*  MG 2.0 2.3  --  2.3  --   PHOS  --   --   --  3.1  --    Liver Function Tests: Recent Labs  Lab 02/19/20 1659 02/20/20 0033 02/21/20 1026  AST 37 32 27  ALT 29 23 20   ALKPHOS 67 64 56  BILITOT 0.6 0.5 0.5  PROT 7.2 6.6 6.2*  ALBUMIN 3.9 3.6 3.1*   CBG: Recent Labs  Lab 02/19/20 1651  GLUCAP 176*    Time spent: 35 minutes  Signed:  Berle Mull  [facility] 02/22/2020

## 2020-02-25 ENCOUNTER — Telehealth: Payer: Self-pay

## 2020-02-25 NOTE — Telephone Encounter (Signed)
Message received from Jacalyn Lefevre CM requesting an appointment for the patient to establish care at [facility]. Call placed to patient # [phone] with assistance of Spanish Interpreter # [phone]/[employer].    The patient explained that her daughter has already scheduled her for an appointment with another PCP as well as with her surgeon..  When offered the phone number for [facility] to call if she has any questions in the future, she said again that she already has an appointment with a PCP.  Updated sent to D. Alvira Monday, RN CM

## 2020-03-16 ENCOUNTER — Encounter: Payer: Self-pay | Admitting: Neurology

## 2020-03-16 ENCOUNTER — Ambulatory Visit (INDEPENDENT_AMBULATORY_CARE_PROVIDER_SITE_OTHER): Payer: Medicare HMO | Admitting: Neurology

## 2020-03-16 VITALS — BP 118/73 | HR 83 | Ht 63.0 in | Wt 240.0 lb

## 2020-03-16 DIAGNOSIS — J9601 Acute respiratory failure with hypoxia: Secondary | ICD-10-CM | POA: Diagnosis not present

## 2020-03-16 DIAGNOSIS — E662 Morbid (severe) obesity with alveolar hypoventilation: Secondary | ICD-10-CM | POA: Diagnosis not present

## 2020-03-16 DIAGNOSIS — T402X4A Poisoning by other opioids, undetermined, initial encounter: Secondary | ICD-10-CM

## 2020-03-16 DIAGNOSIS — N1832 Chronic kidney disease, stage 3b: Secondary | ICD-10-CM

## 2020-03-16 DIAGNOSIS — J9602 Acute respiratory failure with hypercapnia: Secondary | ICD-10-CM

## 2020-03-16 DIAGNOSIS — R351 Nocturia: Secondary | ICD-10-CM | POA: Insufficient documentation

## 2020-03-16 DIAGNOSIS — Z6841 Body Mass Index (BMI) 40.0 and over, adult: Secondary | ICD-10-CM

## 2020-03-16 NOTE — Patient Instructions (Signed)
Apnea del sueo Sleep Apnea La apnea del sueo afecta la respiracin mientras se duerme. Hace que la respiracin se detenga por poco tiempo o se vuelva superficial. Tambin puede aumentar el riesgo de:  Infarto de miocardio.  Accidente cerebrovascular.  Tener mucho sobrepeso (obesidad).  Diabetes.  Insuficiencia cardaca.  Latidos cardacos irregulares. El [place] del tratamiento es ayudarle a respirar normalmente otra vez. Cules son las causas? Existen tres tipos de apnea del sueo:  Apnea obstructiva del sueo. Esta ocurre cuando las vas respiratorias se obstruyen o colapsan.  Apnea central del sueo. Esta ocurre cuando el cerebro no enva las seales correctas a los msculos que controlan la respiracin.  Apnea mixta del sueo. Esta es una combinacin de apnea obstructiva y central del sueo. La causa ms frecuente de esta afeccin es la obstruccin o el colapso de las vas respiratorias. Esto puede suceder si:  Los msculos de la garganta estn demasiado relajados.  Tiene la lengua y las amgdalas demasiado grandes.  Tiene sobrepeso.  Tiene las vas respiratorias demasiado pequeas.   Qu incrementa el riesgo?  Tener sobrepeso.  Fumar.  Tener vas respiratorias pequeas.  El envejecimiento.  Ser hombre.  El consumo de alcohol.  Tomar medicamentos para calmarse (sedantes o tranquilizantes).  Tener familiares con esta afeccin. Cules son los signos o los sntomas?  Dificultad para permanecer dormido.  Estar somnoliento o [place].  Enojarse mucho.  Ronquidos fuertes.  Dolor de cabeza por la maana.  Imposibilidad de enfocar la mente (concentrarse).  Olvidar cosas.  Menos inters por el sexo.  Cambios en el estado de nimo.  Cambios en la personalidad.  Sentimientos de tristeza (depresin).  Levantarse mucho durante la noche para ir a Garment/textile technologist.  Sequedad en la boca.  Dolor de Investment banker, operational. Cmo se diagnostica?  Sus  antecedentes mdicos.  Un examen fsico.  Ardelia Mems prueba que se realiza mientras la persona duerme (estudio del sueo). La prueba se realiza con mayor frecuencia en un laboratorio del sueo, pero tambin puede Press photographer. Cmo se trata?  Dormir de Associate Professor.  Usar un medicamento para eliminar la mucosidad de la nariz (descongestivo).  Evitar el consumo de alcohol, medicamentos que ayudan a relajarse o ciertos analgsicos (narcticos).  Bajar de [place], si es necesario.  Cambios en la dieta.  No fumar.  Usar una mquina para abrir las vas respiratorias mientras duerme; por ejemplo: ? Un aparato bucal. Se trata de una boquilla que desplaza la mandbula hacia adelante. ? Un dispositivo CPAP. Este dispositivo sopla aire a travs de una mscara cuando usted exhala. ? Un dispositivo EPAP. Este tiene vlvulas que se colocan en cada fosa nasal. ? Un dispositivo BPAP. Este dispositivo sopla aire a travs de una mscara cuando usted inhala y exhala.  Someterse a Psychiatric nurse tratamientos no Barista. Realizar un tratamiento para la apnea del sueo es importante. Sin tratamiento, esta afeccin puede derivar en lo siguiente:  Presin arterial alta.  Arteriopata coronaria.  En los hombres, no poder tener una ereccin (impotencia).  Reduccin de la capacidad de pensar.   Siga estas instrucciones en su casa: Estilo de [employer] cambios que le haya recomendado el mdico.  Siga una dieta saludable.  Baje de peso, si es necesario.  Evite el alcohol, los medicamentos para relajarse y Psychologist, forensic.  No consuma ningn producto que contenga nicotina o tabaco, como cigarrillos, cigarrillos electrnicos y tabaco de Higher education careers adviser. Si necesita ayuda para dejar de fumar, consulte al mdico.  Instrucciones generales  [employer] de venta libre y los recetados solamente como se lo haya indicado el mdico.  Si le proporcionaron una mquina para usar mientras  duerme, sela solamente como se lo haya indicado el mdico.  Si va a someterse a [place], no olvide informarle al mdico que tiene apnea del sueo. Puede ser necesario que lleve su dispositivo consigo.  Concurra a todas las visitas de seguimiento como se lo haya indicado el mdico. Esto es importante. Comunquese con un mdico si:  El Charity fundraiser para usar mientras duerme le [employer] o parece no funcionar.  No se siente mejor.  Empeora. Solicite ayuda inmediatamente si:  Le duele el pecho.  Tiene dificultad para inhalar suficiente aire.  Tiene molestias en la espalda, en los brazos o en el [place].  Tiene dificultad para hablar.  Siente debilidad en un lado del cuerpo.  Se le cae un lado de la cara. Estos sntomas pueden Sales executive. No espere a ver si los sntomas desaparecen. Solicite atencin mdica de inmediato. Comunquese con el servicio de emergencias de su localidad (911 en los [place]). No conduzca por sus propios medios [employer] hospital. Resumen  Esta afeccin afecta la respiracin durante el sueo.  La causa ms frecuente es la obstruccin o el colapso de las vas respiratorias.  El [place] del tratamiento es ayudarlo a respirar normalmente mientras duerme. Esta informacin no tiene Marine scientist el consejo del mdico. Asegrese de hacerle al mdico cualquier pregunta que tenga. Document Revised: 10/10/2017 Document Reviewed: 10/10/2017 Elsevier Patient Education  2021 [place]. [email or web address]">  Sndrome de hipoventilacin y obesidad Obesity-Hypoventilation Syndrome El sndrome de hipoventilacin y obesidad (SHO) es una afeccin que hace que una persona no pueda dejar que el aire entre y salga de los pulmones de forma eficiente (ventilar). Esta afeccin ocasiona hipoventilacin, lo que significa que en la sangre se acumula  dixido de carbonoy disminuye la concentracin de oxgeno. Los factores clave para el SHO incluyen el exceso de Air traffic controller, u obesidad, y los altos niveles de dixido de carbono (hipercapnia) [place] y la vigilia. El SHO puede aumentar el riesgo de:  Cardiopata pulmonar o insuficiencia cardaca del lado derecho.  Insuficiencia cardaca congestiva.  Hipertensin pulmonar, o presin arterial elevada en las arterias de los pulmones.  Exceso de glbulos rojos en el cuerpo.  Discapacidad o muerte. Cules son las causas? Esta afeccin puede ser causada por lo siguiente:  Ser obeso con un IMC (ndice de masa corporal) mayor o igual que 30 kg/m2.  Tener mucha grasa alrededor del abdomen, el pecho y el cuello.  Incapacidad del cerebro para controlar adecuadamente el nivel alto de dixido de carbono y [place] bajo de oxgeno.  Hormonas producidas por las clulas adiposas. Estas hormonas pueden interferir con la respiracin.  Apnea del sueo. Ocurre cuando la respiracin se detiene, se pausa o es superficial durante el sueo. Cules son los signos o sntomas? Los sntomas de esta afeccin incluyen:  Sentirse somnoliento Agricultural consultant.  Dolores de [place]. Estos pueden ser peores por la [place].  Falta de aire.  Ronquidos, ahogos, [employer] o dificultad para respirar al dormir.  Falta de concentracin o Information systems manager.  Cambios en el estado de nimo o sensacin de irritabilidad.  Depresin. Cmo se diagnostica? Este trastorno puede diagnosticarse mediante:  La medicin del ndice de masa muscular [facility]).  Anlisis de sangre para [employer] de bicarbonato srico, dixido de carbono y oxgeno.  Oximetra de pulso para medir la cantidad de oxgeno [employer]. Se utiliza un aparato pequeo que se coloca en un dedo de la mano o del pie o en el lbulo de la [place].  Polisomnografa, o estudio del sueo, para Chief Technology Officer los patrones de respiracin y [place] de  oxgeno y dixido de carbono mientras duerme. Tambin pueden hacerle otras pruebas, incluidas las siguientes:  [place] de trax para descartar otros problemas respiratorios.  Pruebas de los pulmones, o pruebas del funcionamiento pulmonar, para descartar otros problemas respiratorios.  Un ECG (electrocardiograma) o un ecocardiograma para detectar signos de insuficiencia cardaca. Cmo se trata? El tratamiento de esta afeccin puede incluir:  Un dispositivo como un aparato de presin positiva continua (CPAP) o de presin positiva de dos niveles (BPAP) de las vas areas. Estos dispositivos administran presin y, a veces, oxgeno para Museum/gallery exhibitions officer respiracin. Pueden ponerle una mscara sobre la nariz o la boca.  Oxgeno si su nivel de oxgeno en la sangre es bajo.  Un programa para bajar de [place].  Ciruga baritrica o para favorecer la prdida de [place].  Traqueostoma. Se coloca un tubo en la trquea a travs del cuello para ayudar a respirar.   Siga estas instrucciones en su casa: Medicamentos  Use los medicamentos de venta libre y los recetados solamente como se lo haya indicado el mdico.  Pregntele al mdico qu medicamentos son seguros para usted. Es posible que le indiquen que evite los medicamentos como los sedantes y narcticos. Estos pueden afectar la respiracin y Scientist, research (life sciences). Hbitos de sueo  Si le indicaron que use un aparato de CPAP o BPAP, asegrese de entender cmo usarlo. Use el aparato de CPAP o BPAP solamente como se lo haya indicado el mdico.  Trate de dormir al menos 8 horas todas las noches. Comida y bebida  Consuma alimentos ricos en fibra, como frijoles, cereales integrales, y frutas y verduras frescas.  Limite el consumo de alimentos ricos en grasa y azcares procesados, como los alimentos fritos o dulces.  Beba suficiente lquido como para Theatre manager la orina de color amarillo plido.  No beba alcohol si: ? Su mdico le indica no  hacerlo. ? Est embarazada, puede estar embarazada o est tratando de quedar embarazada.   Instrucciones generales  Siga una dieta y un plan de actividad fsica que le ayuden a Science writer y Theatre manager un peso saludable como se lo haya indicado el mdico.  Haga actividad fsica habitualmente como se lo haya indicado el mdico.  No consuma ningn producto que contenga nicotina o tabaco, como cigarrillos, cigarrillos electrnicos y tabaco de Higher education careers adviser. Si necesita ayuda para dejar de consumir estos productos, consulte al mdico.  Concurra a todas las visitas de seguimiento como se lo haya indicado el mdico. Esto es importante.   Comunquese con un mdico si:  Comienza a faltarle el aire o la falta de aire empeora.  Tiene dificultad para despertarse o mantenerse despierto.  Se siente confundido.  Siente dolor en el pecho.  Tiene latidos cardacos irregulares o rpidos.  Se siente mareado o se desmaya.  Tiene tos.  Tiene fiebre. Solicite ayuda de inmediato si: Tiene sntomas de un accidente cerebrovascular. "BE FAST" es una manera fcil de recordar los principales signos de advertencia de un accidente cerebrovascular:  B - Balance (equilibrio). Los signos son mareos, dificultad repentina para caminar o prdida del equilibrio.  E - Eyes (ojos). Los signos son problemas para ver o un cambio repentino en la visin.  F -  Face (rostro). Los signos son debilidad repentina o adormecimiento del rostro, o el rostro o el prpado que se caen hacia un lado.  A - Arms (brazos). Los signos son debilidad o adormecimiento en un brazo. Esto sucede de repente y generalmente en un lado del cuerpo.  S - Speech (habla). Los signos son dificultad para hablar, hablar arrastrando las palabras o dificultad para comprender lo que la gente dice.  T - Time (tiempo). Es tiempo de llamar al servicio de Multimedia programmer. Anote la hora a la que [employer] sntomas. Presenta otros signos de un accidente  cerebrovascular, como los siguientes:  Dolor de cabeza sbito e intenso que no tiene causa aparente.  Nuseas o vmitos.  Convulsiones. Estos sntomas pueden representar un problema grave que constituye Engineer, maintenance (IT). No espere a ver si los sntomas desaparecen. Solicite atencin mdica de inmediato. Comunquese con el servicio de emergencias de su localidad (911 en los [place]). No conduzca por sus propios medios [employer]. Si alguna vez siente que puede lastimarse o lastimar a Producer, television/film/video, o tiene pensamientos de poner fin a su vida, busque ayuda de inmediato. Dirjase al servicio de urgencias ms cercano o:  Comunquese con el servicio de emergencias de su localidad (911 en los [place]).  Llame a una lnea de asistencia al suicida y atencin en crisis como National Suicide Prevention Lifeline ([place]) al [phone]. Est disponible las 24 horas del da en los [place]  Enve un mensaje de texto a la lnea para casos de crisis al [phone] (en los [place] Resumen  El sndrome de hipoventilacin y obesidad Midwife) causa hipoventilacin, lo que significa que en la sangre se acumula dixido de carbonoy disminuye la concentracin de oxgeno.  Los factores clave para el SHO incluyen el exceso de Air traffic controller, u obesidad, y los altos niveles de dixido de carbono (hipercapnia) [place] y la vigilia.  El SHO puede aumentar el riesgo de insuficiencia cardaca, hipertensin pulmonar, discapacidad y [place].  Siga la dieta y Photographer plan de actividad fsica que le haya indicado el mdico. Esta informacin no tiene como fin reemplazar el consejo del mdico. Asegrese de hacerle al mdico cualquier pregunta que tenga. Document Revised: 02/24/2019 Document Reviewed: 02/24/2019 Elsevier Patient Education  2021 [employer].

## 2020-03-16 NOTE — Progress Notes (Addendum)
SLEEP MEDICINE CLINIC    Provider:  Larey Seat, MD  Primary Care Physician:  Patient, No Pcp Per No address on file     Referring Provider: Lavina Hamman, Md [address] [place],  [place] [address]          Chief Complaint according to patient   Patient presents with:    . New Patient (Initial Visit)     Patient with Spanish language interpreter. Recently has had a surgery- and had difficulties to become extubated. Also had abnormal arterial blood gases, indicating hypoventilation, chronic respiratory failure.Marland Kitchen       HISTORY OF PRESENT ILLNESS:  Jody Fitzgerald is a 65 - year- old hispanic female patient ( [place] living in the [place] all her adult life, since 1976)  seen here as a referral on 03/16/2020 from Dr Posey Pronto, after anesthesia complications after abdominal hernia surgery - 02-18-2020 , and abnormal ABG.  She had a diagnosis of opioid overdose, and acute respiratory failure.    I have the pleasure of seeing Jody Fitzgerald today, a right -handed hispanic  female with a suspected apnea  Or hypoventilation  sleep disorder.  She  has a past medical history of Morbid obesity - BMI , Asthma, Cancer ([place]), Chronic kidney disease stage 3, acute respiratory failure, , Hypertension, and Pre-diabetes.     Chief concern according to patient : the patient reports a near death experience - 23-Feb-2020.  According to her chart she presented in the hospital lobby where her son brought her and was found unresponsive, pale and diaphoretic minimally responsive to painful stimuli with the O2 sats down to 30% the patient had to be bagged and prepared for intubation her blood glucose level was 178 and intranasal Narcan spray was administered she became responsive after 2 mg of additional intravenous Narcan.  This was administered at 4:45 PM the patient became responsive after 5 PM she was able to communicate clearly by about 20 more minutes later she had been admitted for  abdominal still feels you were given a generous amount of before she left the hospital . ABG Co 2 at 53% and oxygen dropped to 30% brought up to 89% on ABG.       Social history:  Patient has adult children  , a son in the area and a daughter in [place].  She is retired from Regulatory affairs officer, she lives in a household alone. Family status is single-  with 6 children, 5 living children, all adult.  Tobacco use: 2017, heavy smoker-  .  ETOH use ; none   Caffeine intake in form of Coffee( decaffeinated) Soda( /) Tea ( /) or energy drinks. Regular exercise in form of ; nothing          Sleep habits are as follows: The patient's dinner time is between 5-6 PM.  The patient goes to bed at variable times in PM and she spends all time in bed- gets only up to eat- continues to sleep for hours. She estimated her sleep time to be 10-14 hours.  The preferred sleep position is dictated by pain-, with the support of 3 pillows.   There is no usual rise time.  The patient wakes up spontaneously.  She reports being medicated most of the day-  Naps are taken.frequently.     Review of Systems: Out of a complete 14 system review, the patient complains of only the following 1, and all other reviewed systems are negative.:  Fatigue, sleepiness , snoring, fragmented sleep, under medication.   NOCTURIA up to 5 times.   Poor ambulation, uses walker with difficulties.    How likely are you to doze in the following situations: 0 = not likely, 1 = slight chance, 2 = moderate chance, 3 = high chance   Sitting and Reading? Watching Television? Sitting inactive in a public place (theater or meeting)? As a passenger in a car for an hour without a break? Lying down in the afternoon when circumstances permit? Sitting and talking to someone? Sitting quietly after lunch without alcohol? In a car, while stopped for a few minutes in traffic?   Total = unable to endorse - zero points / 24 points    Social History    Socioeconomic History  . Marital status: Single    Spouse name: Not on file  . Number of children: Not on file  . Years of education: Not on file  . Highest education level: Not on file  Occupational History  . Not on file  Tobacco Use  . Smoking status: Former Smoker    Types: Cigarettes  . Smokeless tobacco: Never Used  . Tobacco comment: stop seven years ago  Vaping Use  . Vaping Use: Never used  Substance and Sexual Activity  . Alcohol use: Never  . Drug use: Never  . Sexual activity: Not on file  Other Topics Concern  . Not on file  Social History Narrative  . Not on file   Social Determinants of Health   Financial Resource Strain: Not on file  Food Insecurity: Not on file  Transportation Needs: Not on file  Physical Activity: Not on file  Stress: Not on file  Social Connections: Not on file    Family History  Problem Relation Age of Onset  . Diabetes Other     Past Medical History:  Diagnosis Date  . Asthma   . Cancer ([place])    cervix  . 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 Hypertension   . Pre-diabetes     Past Surgical History:  Procedure Laterality Date  . ABDOMINAL HYSTERECTOMY    . TOTAL NEPHRECTOMY Right   . VENTRAL HERNIA REPAIR Right 02/18/2020   Procedure: LAPAROSCOPIC VENTRAL WALL HERNIA REPAIR WITH  LYSIS OF ADHESIONS;  Surgeon: Michael Boston, MD;  Location: [facility];  Service: General;  Laterality: Right;     Current Outpatient Medications on File Prior to Visit  Medication Sig Dispense Refill  . amLODipine (NORVASC) 5 MG tablet Take 5 mg by mouth daily.    . methocarbamol (ROBAXIN) 750 MG tablet Take 1 tablet (750 mg total) by mouth 4 (four) times daily as needed (use for muscle cramps/pain). (Patient taking differently: Take 750 mg by mouth 4 (four) times daily as needed for muscle spasms.) 30 tablet 2  . polyethylene glycol (MIRALAX / GLYCOLAX) 17 g packet Take 17 g by mouth daily. 14 each 0  .  simethicone (MYLICON) 80 MG chewable tablet Chew 1 tablet (80 mg total) by mouth 4 (four) times daily. 30 tablet 0   No current facility-administered medications on file prior to visit.    Allergies  Allergen Reactions  . Nsaids     Avoid NSAIDS related to nephrectomy.  . Oxycodone Other (See Comments)    oversedated    Physical exam:  There were no vitals filed for this visit. There is no height or weight on file to calculate BMI.  Wt Readings from Last 3 Encounters:  02/20/20 252 lb 3.3 oz (114.4 kg)  02/18/20 242 lb (109.8 kg)  02/09/20 242 lb (109.8 kg)     Ht Readings from Last 3 Encounters:  02/20/20 5\' 6"  (1.676 m)  02/18/20 5\' 6"  (1.676 m)  02/09/20 5\' 6"  (1.676 m)       With interpreter. - prolonged visit-   General: The patient is awake, alert and appears not in acute distress. The patient is not dressed for the season.  Head: Normocephalic, atraumatic.  Neck is supple. Mallampati 3 plus, pale  ,  neck circumference:17 inches .  Nasal airflow  patent.  Retrognathia is not seen.  Dental status: poor Cardiovascular:  Regular rate and cardiac rhythm by pulse. Respiratory: Lungs are clear to auscultation, .  Skin:  With evidence of severe ankle edema- swelling in all extremities.  Trunk: The patient's posture is relaxed.    Neurologic exam : The patient is awake and alert, seemingly oriented to place and time.   Memory subjective described as intact.  Attention span & concentration ability appears normal.  Speech is fluent, but she is not answering questions, she goes on and on about her near death experience.  She is Set designer.    Cranial nerves: no loss of smell or taste reported  Pupils are equal and briskly reactive to light. Funduscopic exam deferred.   Extraocular movements in vertical and horizontal planes were intact and without nystagmus. No Diplopia. Visual fields by finger perimetry are intact. Hearing was intact to soft voice and finger rubbing.     Facial sensation intact to fine touch.  Facial motor strength is symmetric and tongue and uvula move midline.  Neck ROM : rotation, tilt and flexion extension were normal for age and shoulder shrug was symmetrical.    Motor exam:  Symmetric bulk, tone and ROM. Upper extremitities.    Normal tone without cog wheeling, symmetric grip strength .   Sensory:  Fine touch,and vibration were normal in upper extremities. .  Proprioception- deferred.    Coordination: Rapid alternating movements in the fingers/hands were of reduced speed.  The Finger-to-nose maneuver was very slow and she would open her eyes.    Gait and station:  deferred. This is a sleep consultation,   Deep tendon reflexes: in the upper extremities are symmetric and intact.  Babinski response was deferred .       After spending a total time of 60 minutes face to face and additional time for physical and neurologic examination, review of laboratory studies,  personal review of imaging studies, reports and results of other testing and review of referral information / records as far as provided in visit, I have established the following assessments:  Review of medical records and interview with translator:    1) the patient had a ventral hernia repair and was apparently having (stated when she was discharged this medication culminated into an acute respiratory failure, she had also elevated creatinine level of 1.4 glucose was at least not a concern in terms of losing consciousness because of hyper or hypoglycemia was 190 at the time sodium was slightly low at 134 potassium was 4.0 chloride was 96 the patient had been given Narcan first as a nasal spray then is an IV drug before she slowly woke up.    Her oxygen level was measured in the 30s which is surprising, but once an anterior blood level was gathered it showed hyper apnea and no longer hypoxemia.  So she had an incisional hernia of the right flank for an abdominal midline  hernia lap repair with a mesh implant on 02/18/2020.   In short, Jody Fitzgerald is presenting with :  1) Acute respiratory failure with hypoxia and hypercapnia may be not just medication related but also she was not able to deep breathe.   2) She reports that she was unable to trigger the exhalation device that was given to her to test her pulmonary and respiratory activity.   She could not get a little ball up to the full level.  She has also chronic kidney disease.  3) she does not have known obstructive sleep apnea according to her with referral but the suspicion is that she must have obstructive sleep apnea given the presentation during anesthesia.   40 She had also acute blood loss and she has an elevated TSH.    So I think she is hypoventilating by morbid obesity stage III with a BMI over 40, she is also hypoventilatory because of opiate medication at the time, and because her abdomen was surgically treated and she was probably due to pain not able to take a deep breath.    My Plan is to proceed with:  1) patient will not be able to have an attended sleep study- we will use a HST device to screen her for apnea.  2) pain medication has been discontinued.  3) patient has refused rehabilitation in an nursing home.   I would like to thank  Lavina Hamman, Md [address] [place],  [place] [phone] for allowing me to meet with and to take care of this pleasant patient.    I plan to follow up either personally or through our NP within 3 month.    Electronically signed by: Larey Seat, MD 03/16/2020 11:33 AM  [facility] Board certified by The [employer] of Sleep Medicine and Diplomate of the [employer] of Sleep Medicine. Board certified In Neurology through the [place], Fellow of the [employer] of Neurology. Medical Director of [employer].

## 2020-05-17 ENCOUNTER — Telehealth: Payer: Self-pay

## 2020-05-17 NOTE — Telephone Encounter (Signed)
Calling Patient to confirm home sleep test was worn/used.

## 2021-03-04 ENCOUNTER — Other Ambulatory Visit: Payer: Self-pay

## 2021-03-04 ENCOUNTER — Encounter: Payer: Self-pay | Admitting: Endocrinology

## 2021-03-04 ENCOUNTER — Ambulatory Visit (INDEPENDENT_AMBULATORY_CARE_PROVIDER_SITE_OTHER): Payer: Medicare HMO | Admitting: Endocrinology

## 2021-03-04 VITALS — BP 140/120 | HR 72 | Ht 63.0 in | Wt 251.0 lb

## 2021-03-04 DIAGNOSIS — R7989 Other specified abnormal findings of blood chemistry: Secondary | ICD-10-CM

## 2021-03-04 MED ORDER — LEVOTHYROXINE SODIUM 50 MCG PO TABS: 50.0000 ug | ORAL_TABLET | Freq: Every day | ORAL | 3 refills | Status: AC

## 2021-03-04 NOTE — Progress Notes (Signed)
Subjective:    Patient ID: Jody Fitzgerald, female    DOB: [birth year], 66 y.o.   MRN: [id number]  HPI Video interpreter Pt is referred by Dr Ronnald Ramp, for hypothyroidism.  Pt reports hypothyroidism was dx'ed in 2022.  she has never been on prescribed thyroid hormone therapy.  she has never taken non-prescribed thyroid product.  she had thyroid US in 2022.  She has never had thyroid surgery, or XRT to the neck.  she has never been on amiodarone or lithium.  Main symptom is inability to lose weight.   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   Hypertension    Pre-diabetes     Past Surgical History:  Procedure Laterality Date   ABDOMINAL HYSTERECTOMY     TOTAL NEPHRECTOMY Right    VENTRAL HERNIA REPAIR Right 02/18/2020   Procedure: LAPAROSCOPIC VENTRAL WALL HERNIA REPAIR WITH  LYSIS OF ADHESIONS;  Surgeon: Michael Boston, MD;  [facility];  Service: General;  Laterality: Right;    Social History   Socioeconomic History   Marital status: Single    Spouse name: Not on file   Number of children: Not on file   Years of education: Not on file   Highest education level: Not on file  Occupational History   Not on file  Tobacco Use   Smoking status: Former    Types: Cigarettes   Smokeless tobacco: Never   Tobacco comments:    stop seven years ago  Vaping Use   Vaping Use: Never used  Substance and Sexual Activity   Alcohol use: Never   Drug use: Never   Sexual activity: Not on file  Other Topics Concern   Not on file  Social History Narrative   Not on file   Social Determinants of Health   Financial Resource Strain: Not on file  Food Insecurity: Not on file  Transportation Needs: Not on file  Physical Activity: Not on file  Stress: Not on file  Social Connections: Not on file  Intimate Partner Violence: Not on file    Current Outpatient Medications on File Prior to  Visit  Medication Sig Dispense Refill   amLODipine (NORVASC) 5 MG tablet Take 5 mg by mouth daily.     lisinopril-hydrochlorothiazide (ZESTORETIC) 20-25 MG tablet Take by mouth.     methocarbamol (ROBAXIN) 750 MG tablet Take 1 tablet (750 mg total) by mouth 4 (four) times daily as needed (use for muscle cramps/pain). (Patient taking differently: Take 750 mg by mouth 4 (four) times daily as needed for muscle spasms.) 30 tablet 2   polyethylene glycol (MIRALAX / GLYCOLAX) 17 g packet Take 17 g by mouth daily. 14 each 0   simethicone (MYLICON) 80 MG chewable tablet Chew 1 tablet (80 mg total) by mouth 4 (four) times daily. 30 tablet 0   No current facility-administered medications on file prior to visit.    Allergies  Allergen Reactions   Nsaids     Avoid NSAIDS related to nephrectomy.   Oxycodone Other (See Comments)    oversedated    Family History  Problem Relation Age of Onset   Diabetes Other    Thyroid disease Neg Hx     BP (!) 140/120    Pulse 72    Ht 5\' 3"  (1.6 m)    Wt 251 lb (113.9 kg)  SpO2 95%    BMI 44.46 kg/m    Review of Systems denies memory loss, constipation, and cold intolerance.  She has dry skin.      Objective:   Physical Exam VS: see vs page GEN: no distress HEAD: head: no deformity eyes: no periorbital swelling, no proptosis external nose and ears are normal NECK: supple, thyroid is not enlarged CHEST WALL: no deformity LUNGS: clear to auscultation CV: reg rate and rhythm, no murmur.  MUSCULOSKELETAL: gait is normal and steady.   EXTEMITIES: no deformity.  no leg edema NEURO:  readily moves all 4's.  sensation is intact to touch on all 4's SKIN:  Normal texture and temperature.  No rash or suspicious lesion is visible.   NODES:  None palpable at the neck.   PSYCH: alert, well-oriented.  Does not appear anxious nor depressed.     Lab Results  Component Value Date   CREATININE 0.89 02/21/2020   BUN 13 02/21/2020   NA 138 02/21/2020   K 4.0  02/21/2020   CL 102 02/21/2020   CO2 30 02/21/2020   Lab Results  Component Value Date   TSH 7.856 (H) 02/20/2020   T3TOTAL 104 02/20/2020   I have reviewed outside records, and summarized: Pt was noted to have elevated TSH, and referred here.  HTN, dyslipidemia, and hyperglycemia were also addressed     Assessment & Plan:  Hypothyroidism, usually due to chronic thyroiditis: uncontrolled  Patient Instructions  Your blood pressure is high today.  Please see your primary care provider soon, to have it rechecked I have sent a prescription to your pharmacy, for the thyroid hormone pill. Please come back for a follow-up appointment in 1 month.   We are obtaining the ultrasound report from [facility].     Su presin arterial es alta hoy. Consulte a su proveedor de atencin primaria pronto para que lo vuelvan a revisar He enviado una receta a su farmacia para la pldora de hormona tiroidea. Vuelva para una cita de seguimiento en 1 mes. Estamos obteniendo el informe [facility].

## 2021-03-04 NOTE — Patient Instructions (Addendum)
Your blood pressure is high today.  Please see your primary care provider soon, to have it rechecked I have sent a prescription to your pharmacy, for the thyroid hormone pill. Please come back for a follow-up appointment in 1 month.   We are obtaining the ultrasound report from [facility].     Su presin arterial es alta hoy. Consulte a su proveedor de atencin primaria pronto para que lo vuelvan a revisar He enviado una receta a su farmacia para la pldora de hormona tiroidea. Vuelva para una cita de seguimiento en 1 mes. Estamos obteniendo el informe [facility].

## 2021-04-01 ENCOUNTER — Other Ambulatory Visit: Payer: Self-pay

## 2021-04-01 ENCOUNTER — Ambulatory Visit (INDEPENDENT_AMBULATORY_CARE_PROVIDER_SITE_OTHER): Payer: Medicare HMO | Admitting: Endocrinology

## 2021-04-01 ENCOUNTER — Telehealth: Payer: Self-pay | Admitting: Endocrinology

## 2021-04-01 VITALS — BP 130/90 | HR 74 | Ht 63.0 in | Wt 249.6 lb

## 2021-04-01 DIAGNOSIS — R7989 Other specified abnormal findings of blood chemistry: Secondary | ICD-10-CM | POA: Diagnosis not present

## 2021-04-01 LAB — TSH: TSH: 5.36 u[IU]/mL (ref 0.35–5.50)

## 2021-04-01 LAB — T4, FREE: Free T4: 0.78 ng/dL (ref 0.60–1.60)

## 2021-04-01 NOTE — Telephone Encounter (Signed)
At last ov, pt signed ROI for 2022 [place] report.  Do we have it?  If not, please ask [facility] to resend.  TY ?

## 2021-04-01 NOTE — Patient Instructions (Addendum)
Blood tests are requested for you today.  We'll let you know about the results.  ?Please come back for a follow-up appointment in 4 months.   ?We are again working to obtain the ultrasound report from [facility].   ?If we do not receive, I'll request another from [facility]   ? ? ?Se solicitan an?lisis de sangre para usted hoy. Te informaremos [employer].  ?Vuelva para una cita de seguimiento en 4 meses. ?Nuevamente estamos trabajando para obtener el informe de [place].   ?Si no recibimos, solicitar? [facility].   ?

## 2021-04-01 NOTE — Progress Notes (Signed)
? ?Subjective:  ? ? Patient ID: Jody Fitzgerald, female    DOB: [birth year], 66 y.o.   MRN: [id number] ? ?HPI ?Video interpreter ?Pt returns for f/u of hypothyroidism (dx'ed 2022; she was rx'ed synthroid; she had thyroid US in 2022).  Since on synthroid, pt states she feels no different in general.  I don't know if we have received the [place] reports, as it might be in scanning.   ?Past Medical History:  ?Diagnosis Date  ? Asthma   ? Cancer [facility])   ? cervix  ? Chronic kidney disease   ? 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 Hypertension   ? Pre-diabetes   ? ? ?Past Surgical History:  ?Procedure Laterality Date  ? ABDOMINAL HYSTERECTOMY    ? TOTAL NEPHRECTOMY Right   ? VENTRAL HERNIA REPAIR Right 02/18/2020  ? Procedure: LAPAROSCOPIC VENTRAL WALL HERNIA REPAIR WITH  LYSIS OF ADHESIONS;  Surgeon: Michael Boston, MD;  [facility];  Service: General;  Laterality: Right;  ? ? ?Social History  ? ?Socioeconomic History  ? Marital status: Single  ?  Spouse name: Not on file  ? Number of children: Not on file  ? Years of education: Not on file  ? Highest education level: Not on file  ?Occupational History  ? Not on file  ?Tobacco Use  ? Smoking status: Former  ?  Types: Cigarettes  ? Smokeless tobacco: Never  ? Tobacco comments:  ?  stop seven years ago  ?Vaping Use  ? Vaping Use: Never used  ?Substance and Sexual Activity  ? Alcohol use: Never  ? Drug use: Never  ? Sexual activity: Not on file  ?Other Topics Concern  ? Not on file  ?Social History Narrative  ? Not on file  ? ?Social Determinants of Health  ? ?Financial Resource Strain: Not on file  ?Food Insecurity: Not on file  ?Transportation Needs: Not on file  ?Physical Activity: Not on file  ?Stress: Not on file  ?Social Connections: Not on file  ?Intimate Partner Violence: Not on file  ? ? ?Current Outpatient Medications on File Prior to Visit  ?Medication Sig Dispense Refill  ? amLODipine (NORVASC) 5 MG tablet Take 5 mg by mouth daily.    ? levothyroxine (SYNTHROID) 50  MCG tablet Take 1 tablet (50 mcg total) by mouth daily. 90 tablet 3  ? lisinopril-hydrochlorothiazide (ZESTORETIC) 20-25 MG tablet Take by mouth.    ? methocarbamol (ROBAXIN) 750 MG tablet Take 1 tablet (750 mg total) by mouth 4 (four) times daily as needed (use for muscle cramps/pain). (Patient taking differently: Take 750 mg by mouth 4 (four) times daily as needed for muscle spasms.) 30 tablet 2  ? polyethylene glycol (MIRALAX / GLYCOLAX) 17 g packet Take 17 g by mouth daily. 14 each 0  ? simethicone (MYLICON) 80 MG chewable tablet Chew 1 tablet (80 mg total) by mouth 4 (four) times daily. 30 tablet 0  ? ?No current facility-administered medications on file prior to visit.  ? ? ?Allergies  ?Allergen Reactions  ? Nsaids   ?  Avoid NSAIDS related to nephrectomy.  ? Oxycodone Other (See Comments)  ?  oversedated  ? ? ?Family History  ?Problem Relation Age of Onset  ? Diabetes Other   ? Thyroid disease Neg Hx   ? ? ?BP 130/90   Pulse 74   Ht 5\' 3"  (1.6 m)   Wt 249 lb 9.6 oz (113.2 kg)   SpO2 98%   BMI 44.21 kg/m?  ? ?  Review of Systems ? ?   ?Objective:  ? Physical Exam ?VITAL SIGNS:  See vs page ?GENERAL: no distress ?NECK: There is no palpable thyroid enlargement.  No thyroid nodule is palpable.  No palpable lymphadenopathy at the anterior neck.   ? ? ?Lab Results  ?Component Value Date  ? TSH 7.856 (H) 02/20/2020  ? T3TOTAL 104 02/20/2020  ? ?   ?Assessment & Plan:  ?Hypothyroidism: due for recheck.  Please continue the same synthroid, pending lab results ? ?Patient Instructions  ?Blood tests are requested for you today.  We'll let you know about the results.  ?Please come back for a follow-up appointment in 4 months.   ?We are again working to obtain the ultrasound report from [facility].   ?If we do not receive, I'll request another from [facility].   ? ? ?Se solicitan an?lisis de sangre para usted hoy. Te informaremos [employer].  ?Vuelva para una cita de seguimiento en 4  meses. ?Nuevamente estamos trabajando para obtener el informe de [place].   ?Si no recibimos, solicitar? [facility].   ? ? ?

## 2021-04-17 ENCOUNTER — Other Ambulatory Visit: Payer: Self-pay | Admitting: Endocrinology

## 2021-04-17 ENCOUNTER — Telehealth: Payer: Self-pay | Admitting: Endocrinology

## 2021-04-17 DIAGNOSIS — R7989 Other specified abnormal findings of blood chemistry: Secondary | ICD-10-CM

## 2021-04-17 NOTE — Telephone Encounter (Signed)
please contact patient: ?We have not received [place] from [place], so I have ordered. you will receive a phone call, about a day and time for an appointment ?

## 2021-04-17 NOTE — Telephone Encounter (Signed)
-----   Message from Renato Shin, MD sent at 04/01/2021  1:43 PM EST ----- ?Order [place] if not received from [facility] in 14 days ? ?

## 2021-04-21 ENCOUNTER — Ambulatory Visit
Admission: RE | Admit: 2021-04-21 | Discharge: 2021-04-21 | Disposition: A | Discharge status: Home / Self Care | Payer: Medicare HMO | Source: Ambulatory Visit | Attending: Endocrinology | Admitting: Endocrinology

## 2021-04-21 DIAGNOSIS — R7989 Other specified abnormal findings of blood chemistry: Secondary | ICD-10-CM

## 2021-05-04 ENCOUNTER — Telehealth: Payer: Self-pay | Admitting: Endocrinology

## 2021-05-04 NOTE — Telephone Encounter (Signed)
Received records from [place], but there is no [place] ?

## 2021-06-09 ENCOUNTER — Other Ambulatory Visit: Payer: Self-pay | Admitting: Family Medicine

## 2021-06-09 ENCOUNTER — Ambulatory Visit
Admission: RE | Admit: 2021-06-09 | Discharge: 2021-06-09 | Disposition: A | Discharge status: Home / Self Care | Payer: Medicare HMO | Source: Ambulatory Visit | Attending: Family Medicine | Admitting: Family Medicine

## 2021-06-09 DIAGNOSIS — Z1231 Encounter for screening mammogram for malignant neoplasm of breast: Secondary | ICD-10-CM

## 2021-11-29 ENCOUNTER — Encounter (HOSPITAL_COMMUNITY): Payer: Self-pay

## 2021-11-29 ENCOUNTER — Emergency Department (HOSPITAL_COMMUNITY)
Admission: EM | Admit: 2021-11-29 | Discharge: 2021-11-29 | Disposition: A | Discharge status: Home / Self Care | Payer: Medicare Other | Attending: Emergency Medicine | Admitting: Emergency Medicine

## 2021-11-29 ENCOUNTER — Emergency Department (HOSPITAL_COMMUNITY): Payer: Medicare Other

## 2021-11-29 DIAGNOSIS — M25512 Pain in left shoulder: Secondary | ICD-10-CM | POA: Insufficient documentation

## 2021-11-29 DIAGNOSIS — M25511 Pain in right shoulder: Secondary | ICD-10-CM | POA: Diagnosis not present

## 2021-11-29 DIAGNOSIS — M542 Cervicalgia: Secondary | ICD-10-CM | POA: Diagnosis not present

## 2021-11-29 DIAGNOSIS — Y92410 Unspecified street and highway as the place of occurrence of the external cause: Secondary | ICD-10-CM | POA: Diagnosis not present

## 2021-11-29 DIAGNOSIS — M50322 Other cervical disc degeneration at C5-C6 level: Secondary | ICD-10-CM | POA: Diagnosis not present

## 2021-11-29 DIAGNOSIS — M546 Pain in thoracic spine: Secondary | ICD-10-CM | POA: Diagnosis not present

## 2021-11-29 DIAGNOSIS — M50323 Other cervical disc degeneration at C6-C7 level: Secondary | ICD-10-CM | POA: Diagnosis not present

## 2021-11-29 MED ORDER — METHOCARBAMOL 500 MG PO TABS
500.0000 mg | ORAL_TABLET | Freq: Four times a day (QID) | ORAL | 0 refills | Status: AC
Start: 2021-11-29 — End: ?

## 2021-11-29 NOTE — Discharge Instructions (Addendum)
Tylenol for pain
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
## 2022-02-15 DIAGNOSIS — N1831 Chronic kidney disease, stage 3a: Secondary | ICD-10-CM | POA: Diagnosis not present

## 2022-02-15 DIAGNOSIS — R519 Headache, unspecified: Secondary | ICD-10-CM | POA: Diagnosis not present

## 2022-02-15 DIAGNOSIS — I700 Atherosclerosis of aorta: Secondary | ICD-10-CM | POA: Diagnosis not present

## 2022-02-15 DIAGNOSIS — R7303 Prediabetes: Secondary | ICD-10-CM | POA: Diagnosis not present

## 2022-02-15 DIAGNOSIS — Z0289 Encounter for other administrative examinations: Secondary | ICD-10-CM | POA: Diagnosis not present

## 2022-02-15 DIAGNOSIS — G63 Polyneuropathy in diseases classified elsewhere: Secondary | ICD-10-CM | POA: Diagnosis not present

## 2022-03-15 DIAGNOSIS — Z13820 Encounter for screening for osteoporosis: Secondary | ICD-10-CM | POA: Diagnosis not present

## 2022-03-15 DIAGNOSIS — Z1211 Encounter for screening for malignant neoplasm of colon: Secondary | ICD-10-CM | POA: Diagnosis not present

## 2022-03-15 DIAGNOSIS — I10 Essential (primary) hypertension: Secondary | ICD-10-CM | POA: Diagnosis not present

## 2022-03-15 DIAGNOSIS — R519 Headache, unspecified: Secondary | ICD-10-CM | POA: Diagnosis not present

## 2022-03-15 DIAGNOSIS — Z0289 Encounter for other administrative examinations: Secondary | ICD-10-CM | POA: Diagnosis not present

## 2022-03-30 DIAGNOSIS — N1831 Chronic kidney disease, stage 3a: Secondary | ICD-10-CM | POA: Diagnosis not present

## 2022-03-30 DIAGNOSIS — Z905 Acquired absence of kidney: Secondary | ICD-10-CM | POA: Diagnosis not present

## 2022-03-30 DIAGNOSIS — I129 Hypertensive chronic kidney disease with stage 1 through stage 4 chronic kidney disease, or unspecified chronic kidney disease: Secondary | ICD-10-CM | POA: Diagnosis not present

## 2022-03-30 DIAGNOSIS — E559 Vitamin D deficiency, unspecified: Secondary | ICD-10-CM | POA: Diagnosis not present

## 2022-03-30 DIAGNOSIS — R7303 Prediabetes: Secondary | ICD-10-CM | POA: Diagnosis not present

## 2022-03-30 DIAGNOSIS — N200 Calculus of kidney: Secondary | ICD-10-CM | POA: Diagnosis not present

## 2022-05-09 DIAGNOSIS — Z0289 Encounter for other administrative examinations: Secondary | ICD-10-CM | POA: Diagnosis not present

## 2022-05-09 DIAGNOSIS — N1831 Chronic kidney disease, stage 3a: Secondary | ICD-10-CM | POA: Diagnosis not present

## 2022-05-09 DIAGNOSIS — E785 Hyperlipidemia, unspecified: Secondary | ICD-10-CM | POA: Diagnosis not present

## 2022-05-09 DIAGNOSIS — Z13820 Encounter for screening for osteoporosis: Secondary | ICD-10-CM | POA: Diagnosis not present

## 2022-05-09 DIAGNOSIS — E039 Hypothyroidism, unspecified: Secondary | ICD-10-CM | POA: Diagnosis not present

## 2022-05-09 DIAGNOSIS — R7303 Prediabetes: Secondary | ICD-10-CM | POA: Diagnosis not present

## 2022-05-09 DIAGNOSIS — I10 Essential (primary) hypertension: Secondary | ICD-10-CM | POA: Diagnosis not present

## 2022-06-06 DIAGNOSIS — I10 Essential (primary) hypertension: Secondary | ICD-10-CM | POA: Diagnosis not present

## 2022-06-06 DIAGNOSIS — Z13820 Encounter for screening for osteoporosis: Secondary | ICD-10-CM | POA: Diagnosis not present

## 2022-06-06 DIAGNOSIS — E1122 Type 2 diabetes mellitus with diabetic chronic kidney disease: Secondary | ICD-10-CM | POA: Diagnosis not present

## 2022-06-06 DIAGNOSIS — Z0289 Encounter for other administrative examinations: Secondary | ICD-10-CM | POA: Diagnosis not present

## 2022-06-28 DIAGNOSIS — Z13820 Encounter for screening for osteoporosis: Secondary | ICD-10-CM | POA: Diagnosis not present

## 2022-07-13 DIAGNOSIS — I10 Essential (primary) hypertension: Secondary | ICD-10-CM | POA: Diagnosis not present

## 2022-07-13 DIAGNOSIS — Z0289 Encounter for other administrative examinations: Secondary | ICD-10-CM | POA: Diagnosis not present

## 2022-07-13 DIAGNOSIS — E1122 Type 2 diabetes mellitus with diabetic chronic kidney disease: Secondary | ICD-10-CM | POA: Diagnosis not present

## 2022-07-13 DIAGNOSIS — Z599 Problem related to housing and economic circumstances, unspecified: Secondary | ICD-10-CM | POA: Diagnosis not present

## 2022-10-30 DIAGNOSIS — E039 Hypothyroidism, unspecified: Secondary | ICD-10-CM | POA: Diagnosis not present

## 2022-10-30 DIAGNOSIS — R29818 Other symptoms and signs involving the nervous system: Secondary | ICD-10-CM | POA: Diagnosis not present

## 2022-10-30 DIAGNOSIS — G63 Polyneuropathy in diseases classified elsewhere: Secondary | ICD-10-CM | POA: Diagnosis not present

## 2022-10-30 DIAGNOSIS — I700 Atherosclerosis of aorta: Secondary | ICD-10-CM | POA: Diagnosis not present

## 2022-10-30 DIAGNOSIS — E1122 Type 2 diabetes mellitus with diabetic chronic kidney disease: Secondary | ICD-10-CM | POA: Diagnosis not present

## 2022-10-30 DIAGNOSIS — Z0289 Encounter for other administrative examinations: Secondary | ICD-10-CM | POA: Diagnosis not present

## 2022-10-30 DIAGNOSIS — N1831 Chronic kidney disease, stage 3a: Secondary | ICD-10-CM | POA: Diagnosis not present

## 2022-10-30 DIAGNOSIS — E785 Hyperlipidemia, unspecified: Secondary | ICD-10-CM | POA: Diagnosis not present

## 2022-10-30 DIAGNOSIS — I10 Essential (primary) hypertension: Secondary | ICD-10-CM | POA: Diagnosis not present

## 2022-11-27 ENCOUNTER — Other Ambulatory Visit: Payer: Self-pay | Admitting: Family Medicine

## 2022-11-27 DIAGNOSIS — R1011 Right upper quadrant pain: Secondary | ICD-10-CM | POA: Diagnosis not present

## 2022-11-27 DIAGNOSIS — Z0289 Encounter for other administrative examinations: Secondary | ICD-10-CM | POA: Diagnosis not present

## 2022-11-27 DIAGNOSIS — I10 Essential (primary) hypertension: Secondary | ICD-10-CM | POA: Diagnosis not present

## 2022-11-27 DIAGNOSIS — E1122 Type 2 diabetes mellitus with diabetic chronic kidney disease: Secondary | ICD-10-CM | POA: Diagnosis not present

## 2022-12-05 ENCOUNTER — Other Ambulatory Visit: Payer: 59

## 2022-12-19 DIAGNOSIS — R0602 Shortness of breath: Secondary | ICD-10-CM | POA: Diagnosis not present

## 2022-12-19 DIAGNOSIS — R942 Abnormal results of pulmonary function studies: Secondary | ICD-10-CM | POA: Diagnosis not present

## 2022-12-25 DIAGNOSIS — I10 Essential (primary) hypertension: Secondary | ICD-10-CM | POA: Diagnosis not present

## 2022-12-25 DIAGNOSIS — Z0001 Encounter for general adult medical examination with abnormal findings: Secondary | ICD-10-CM | POA: Diagnosis not present

## 2022-12-25 DIAGNOSIS — Z0289 Encounter for other administrative examinations: Secondary | ICD-10-CM | POA: Diagnosis not present

## 2022-12-25 DIAGNOSIS — E1122 Type 2 diabetes mellitus with diabetic chronic kidney disease: Secondary | ICD-10-CM | POA: Diagnosis not present

## 2023-02-22 DIAGNOSIS — E1122 Type 2 diabetes mellitus with diabetic chronic kidney disease: Secondary | ICD-10-CM | POA: Diagnosis not present

## 2023-02-22 DIAGNOSIS — E785 Hyperlipidemia, unspecified: Secondary | ICD-10-CM | POA: Diagnosis not present

## 2023-02-22 DIAGNOSIS — Z79899 Other long term (current) drug therapy: Secondary | ICD-10-CM | POA: Diagnosis not present

## 2023-02-22 DIAGNOSIS — Z5982 Transportation insecurity: Secondary | ICD-10-CM | POA: Diagnosis not present

## 2023-02-22 DIAGNOSIS — E1142 Type 2 diabetes mellitus with diabetic polyneuropathy: Secondary | ICD-10-CM | POA: Diagnosis not present

## 2023-02-22 DIAGNOSIS — E039 Hypothyroidism, unspecified: Secondary | ICD-10-CM | POA: Diagnosis not present

## 2023-02-22 DIAGNOSIS — N1831 Chronic kidney disease, stage 3a: Secondary | ICD-10-CM | POA: Diagnosis not present

## 2023-02-22 DIAGNOSIS — I10 Essential (primary) hypertension: Secondary | ICD-10-CM | POA: Diagnosis not present

## 2023-02-22 DIAGNOSIS — Z7985 Long-term (current) use of injectable non-insulin antidiabetic drugs: Secondary | ICD-10-CM | POA: Diagnosis not present

## 2023-05-21 DIAGNOSIS — H40033 Anatomical narrow angle, bilateral: Secondary | ICD-10-CM | POA: Diagnosis not present

## 2023-05-21 DIAGNOSIS — H5203 Hypermetropia, bilateral: Secondary | ICD-10-CM | POA: Diagnosis not present

## 2023-05-23 DIAGNOSIS — I700 Atherosclerosis of aorta: Secondary | ICD-10-CM | POA: Diagnosis not present

## 2023-05-23 DIAGNOSIS — E1122 Type 2 diabetes mellitus with diabetic chronic kidney disease: Secondary | ICD-10-CM | POA: Diagnosis not present

## 2023-05-23 DIAGNOSIS — E039 Hypothyroidism, unspecified: Secondary | ICD-10-CM | POA: Diagnosis not present

## 2023-05-23 DIAGNOSIS — N1831 Chronic kidney disease, stage 3a: Secondary | ICD-10-CM | POA: Diagnosis not present

## 2023-05-23 DIAGNOSIS — I1310 Hypertensive heart and chronic kidney disease without heart failure, with stage 1 through stage 4 chronic kidney disease, or unspecified chronic kidney disease: Secondary | ICD-10-CM | POA: Diagnosis not present

## 2023-05-23 DIAGNOSIS — R54 Age-related physical debility: Secondary | ICD-10-CM | POA: Diagnosis not present

## 2023-05-23 DIAGNOSIS — G63 Polyneuropathy in diseases classified elsewhere: Secondary | ICD-10-CM | POA: Diagnosis not present

## 2023-08-28 IMAGING — MG MM DIGITAL SCREENING BILAT W/ TOMO AND CAD
8 series · 8 of 24 positions shown · non-contrast
Comparison: None available.

CLINICAL DATA: Screening.

EXAM:
DIGITAL SCREENING BILATERAL MAMMOGRAM WITH TOMOSYNTHESIS AND CAD
TECHNIQUE: Bilateral screening digital craniocaudal and mediolateral oblique
mammograms were obtained. Bilateral screening digital breast
tomosynthesis was performed. The images were evaluated with
computer-aided detection.

[L MLO synth-2D]
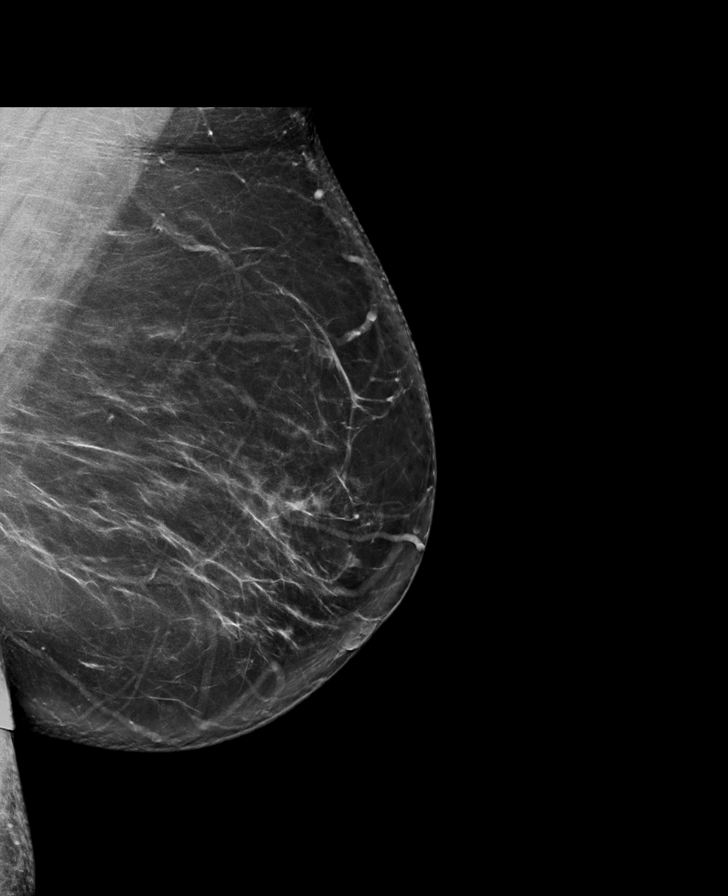

[R CC synth-2D]
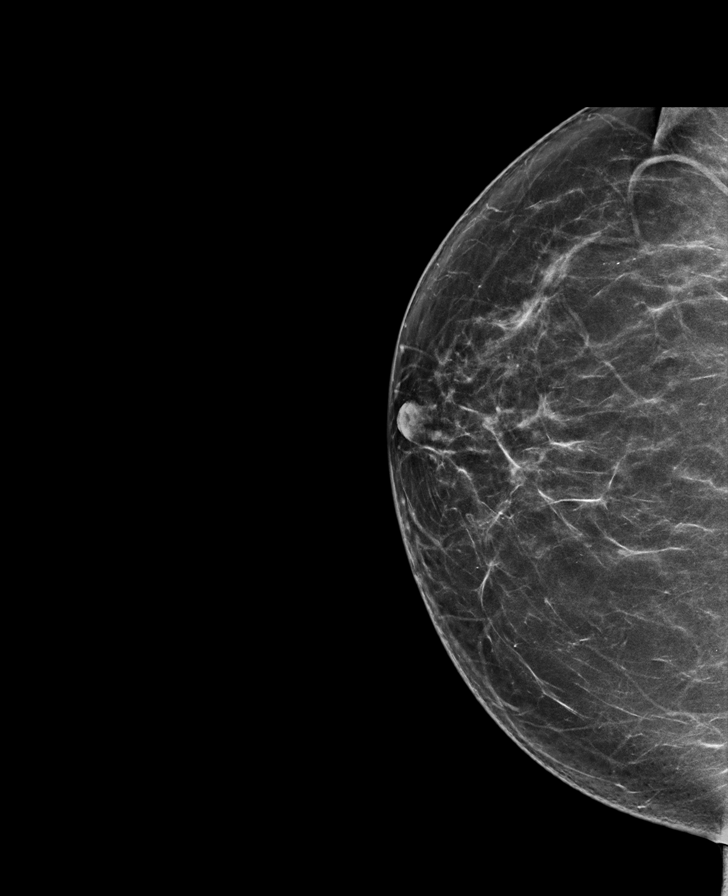

[L CC synth-2D]
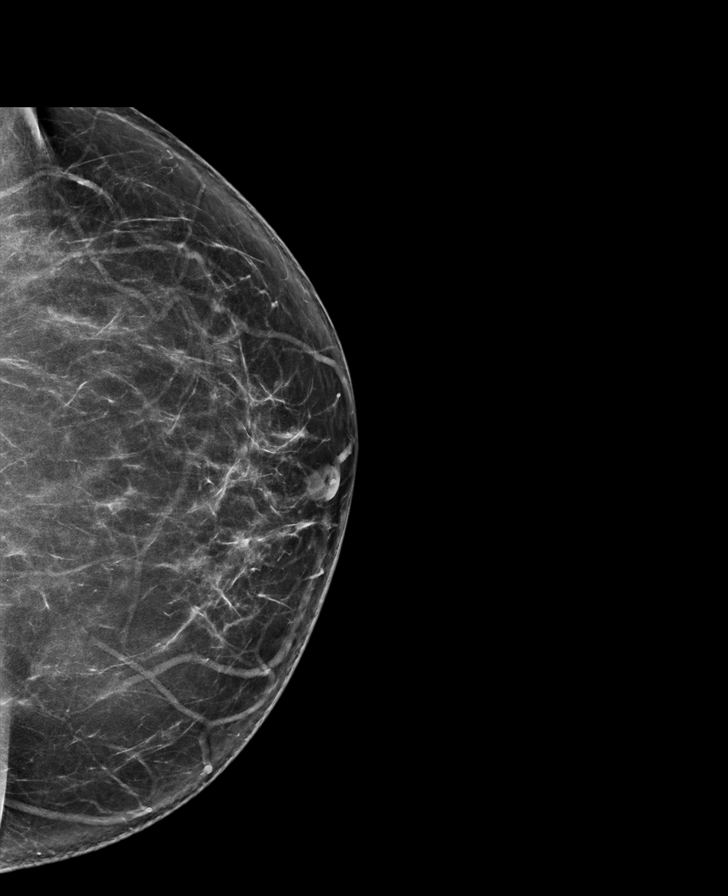

[R MLO synth-2D]
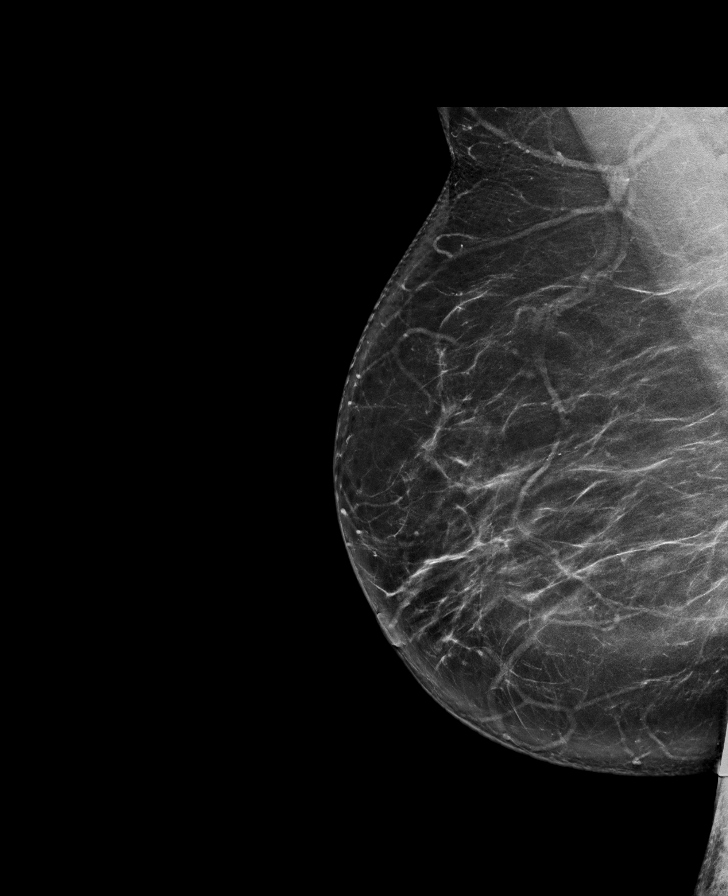

[L MLO tomo · tomo slice 47/92.0]
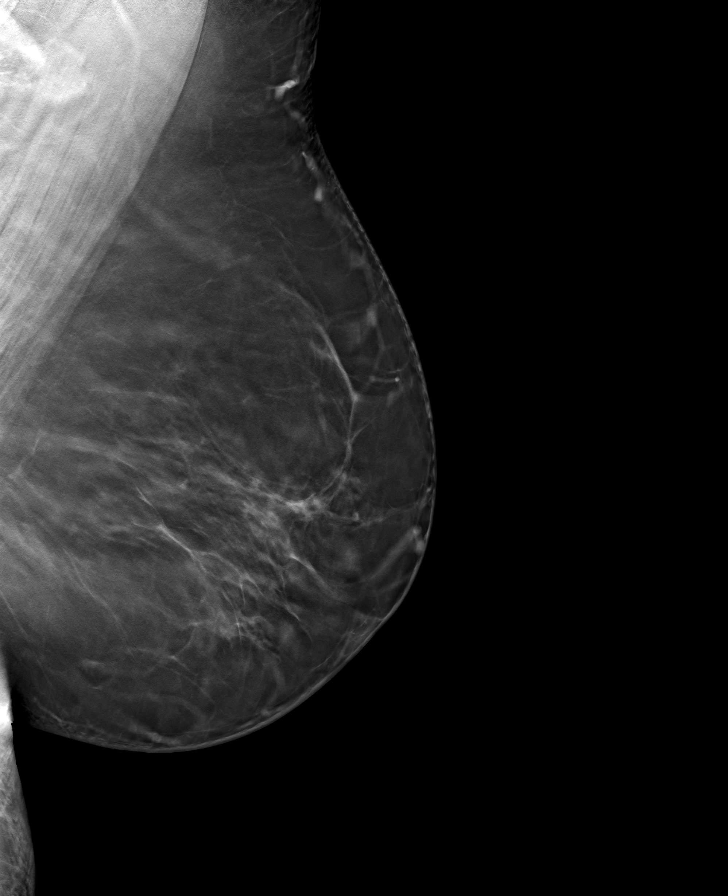

[R CC tomo · tomo slice 37/74.0]
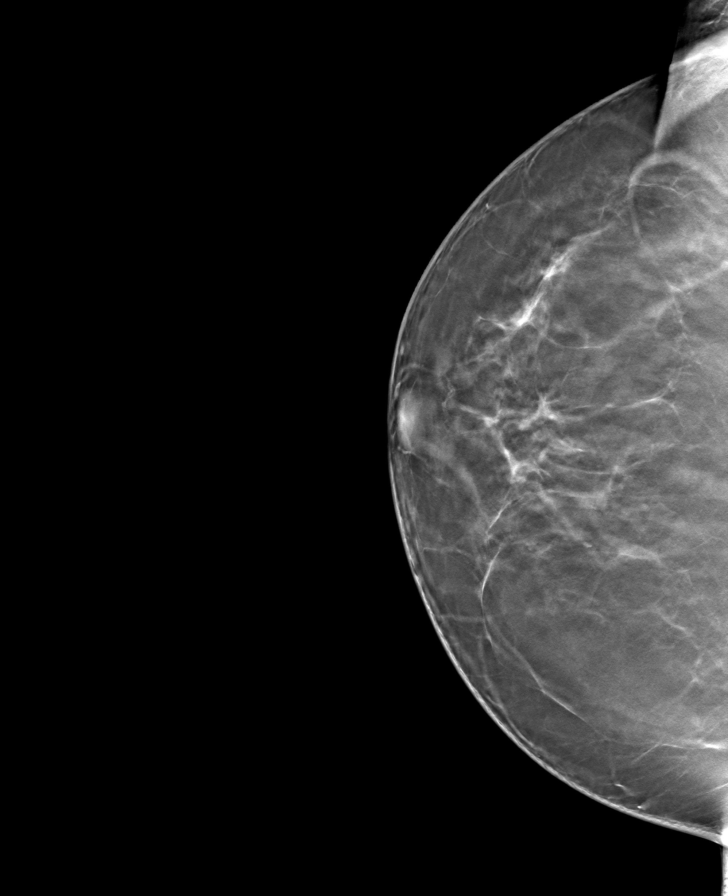

[R MLO tomo · tomo slice 45/89.0]
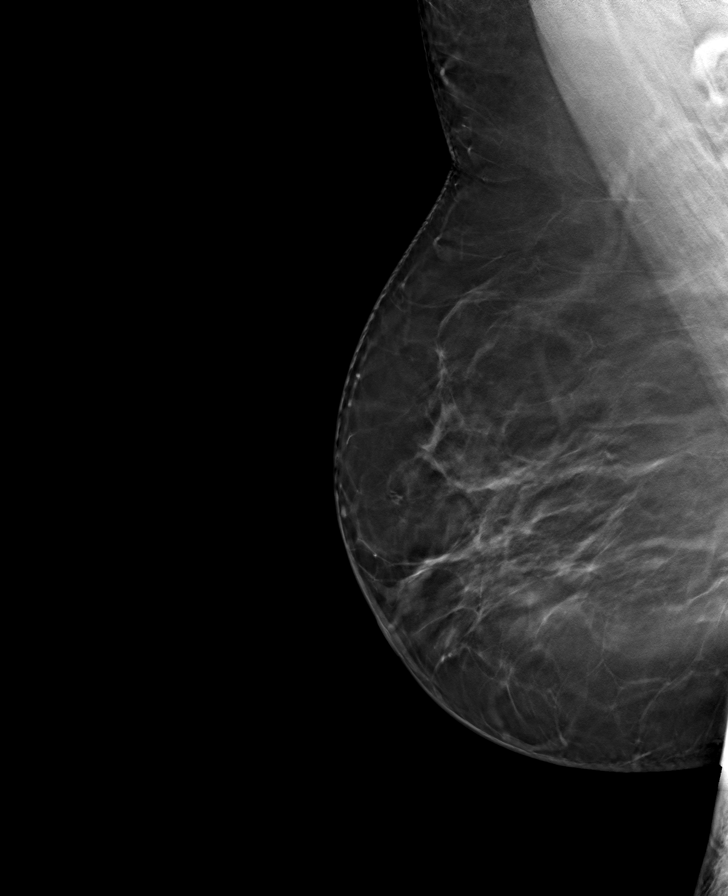

[L CC tomo · tomo slice 39/77.0]
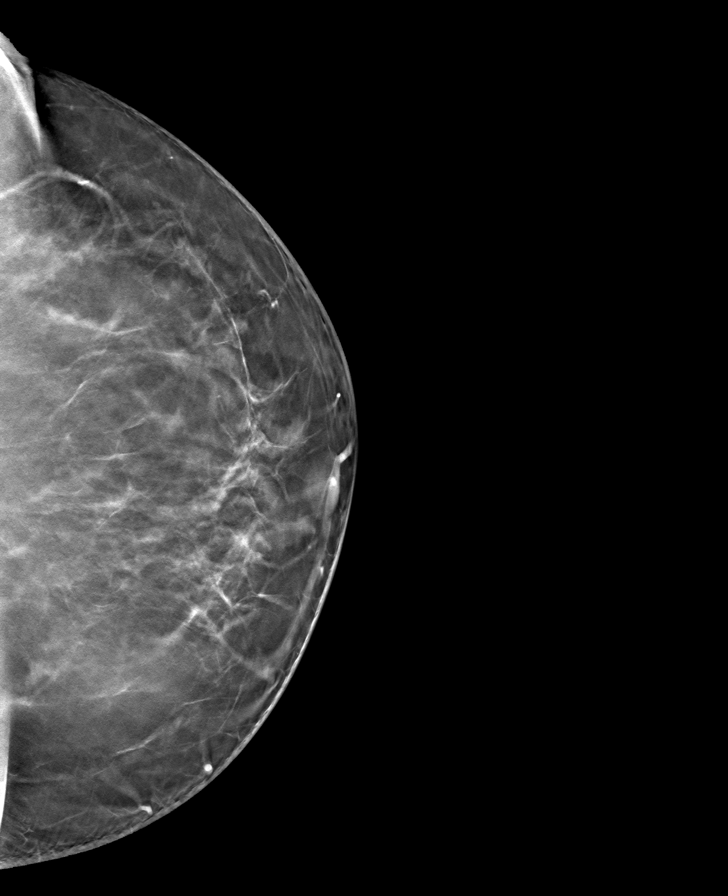

[8 of 24 positions shown; findings below may reference images not displayed]

ACR Breast Density Category b: There are scattered areas of
fibroglandular density.
FINDINGS: There are no findings suspicious for malignancy.
IMPRESSION: No mammographic evidence of malignancy. A result letter of this
screening mammogram will be mailed directly to the patient.

RECOMMENDATION:
Screening mammogram in one year. (Code:[id number]-[id number])

BI-RADS CATEGORY  1: Negative.

## 2023-12-01 ENCOUNTER — Other Ambulatory Visit: Payer: Self-pay

## 2023-12-01 ENCOUNTER — Emergency Department (HOSPITAL_COMMUNITY)
Admission: EM | Admit: 2023-12-01 | Discharge: 2023-12-01 | Disposition: A | Discharge status: Home / Self Care | Attending: Emergency Medicine | Admitting: Emergency Medicine

## 2023-12-01 ENCOUNTER — Emergency Department (HOSPITAL_COMMUNITY)

## 2023-12-01 ENCOUNTER — Encounter (HOSPITAL_COMMUNITY): Payer: Self-pay

## 2023-12-01 DIAGNOSIS — R1011 Right upper quadrant pain: Secondary | ICD-10-CM | POA: Diagnosis present

## 2023-12-01 LAB — URINALYSIS, ROUTINE W REFLEX MICROSCOPIC
Bilirubin Urine: NEGATIVE
Glucose, UA: NEGATIVE mg/dL
Hgb urine dipstick: NEGATIVE
Ketones, ur: 5 mg/dL — AB
Nitrite: NEGATIVE
Protein, ur: 30 mg/dL — AB
Specific Gravity, Urine: 1.023 (ref 1.005–1.030)
pH: 6 (ref 5.0–8.0)

## 2023-12-01 LAB — CBC
HCT: 43.7 % (ref 36.0–46.0)
Hemoglobin: 14 g/dL (ref 12.0–15.0)
MCH: 30.2 pg (ref 26.0–34.0)
MCHC: 32 g/dL (ref 30.0–36.0)
MCV: 94.2 fL (ref 80.0–100.0)
Platelets: 325 K/uL (ref 150–400)
RBC: 4.64 MIL/uL (ref 3.87–5.11)
RDW: 12.8 % (ref 11.5–15.5)
WBC: 7.6 K/uL (ref 4.0–10.5)
nRBC: 0 % (ref 0.0–0.2)

## 2023-12-01 LAB — COMPREHENSIVE METABOLIC PANEL WITH GFR
ALT: 13 U/L (ref 0–44)
AST: 21 U/L (ref 15–41)
Albumin: 3.8 g/dL (ref 3.5–5.0)
Alkaline Phosphatase: 80 U/L (ref 38–126)
Anion gap: 10 (ref 5–15)
BUN: 14 mg/dL (ref 8–23)
CO2: 29 mmol/L (ref 22–32)
Calcium: 9.1 mg/dL (ref 8.9–10.3)
Chloride: 101 mmol/L (ref 98–111)
Creatinine, Ser: 1.07 mg/dL — ABNORMAL HIGH (ref 0.44–1.00)
GFR, Estimated: 57 mL/min — ABNORMAL LOW (ref >60)
Glucose, Bld: 103 mg/dL — ABNORMAL HIGH (ref 70–99)
Potassium: 3.7 mmol/L (ref 3.5–5.1)
Sodium: 140 mmol/L (ref 135–145)
Total Bilirubin: 0.2 mg/dL (ref 0.0–1.2)
Total Protein: 7.5 g/dL (ref 6.5–8.1)

## 2023-12-01 LAB — LIPASE, BLOOD: Lipase: 41 U/L (ref 11–51)

## 2023-12-01 MED ORDER — IOHEXOL 350 MG/ML SOLN
100.0000 mL | Freq: Once | INTRAVENOUS | Status: AC | PRN
  Administered 2023-12-01: 100 mL via INTRAVENOUS

## 2023-12-01 MED ORDER — HYDROCODONE-ACETAMINOPHEN 5-325 MG PO TABS: 1.0000 | ORAL_TABLET | ORAL | 0 refills | Status: AC | PRN

## 2023-12-01 MED ORDER — HYDROMORPHONE HCL 1 MG/ML IJ SOLN
0.5000 mg | Freq: Once | INTRAMUSCULAR | Status: AC
  Administered 2023-12-01: 0.5 mg via INTRAVENOUS
  Filled 2023-12-01: qty 1

## 2023-12-01 MED ORDER — ONDANSETRON HCL 4 MG/2ML IJ SOLN
4.0000 mg | Freq: Once | INTRAMUSCULAR | Status: AC
  Administered 2023-12-01: 4 mg via INTRAVENOUS
  Filled 2023-12-01: qty 2

## 2023-12-01 NOTE — ED Notes (Signed)
 Spanish interpreter used for discharge teaching.   Discharge instructions reviewed.   Newly prescribed medications discussed. Pharmacy verified.   Opportunity for questions and concerns provided.   Alert, oriented and ambulatory.   Displays no signs of distress.

## 2023-12-01 NOTE — Discharge Instructions (Signed)
 Como ya comentamos, las pruebas que le realizaron en urgencias son normales, pero no permiten identificar el origen del engineer, mining. Tome el Norco para chief technology officer segn lo prescrito y consulte con su mdico de cabecera para una evaluacin ambulatoria ms completa.  As we discussed, your tests in the ED are all reassuring and do not identify where your pain is coming from. Take the Norco for pain as prescribed and follow up with your primary care doctor for further outpatient evaluation.

## 2023-12-01 NOTE — ED Provider Notes (Signed)
 [place] EMERGENCY DEPARTMENT AT [facility] Provider Note   CSN: [id number] Arrival date & time: 12/01/23  1246     Patient presents with: Abdominal Pain   Jody Fitzgerald is a 68 y.o. female.   Patient to ED for evaluation of RUQ abdominal pain. She reports the pain started overall about 4 years ago and has been intermittent. For the past 3 days, the pain has been constant and severe. She had nausea and vomiting last night. No diarrhea. She denies the pain is affected by eating. No fever.   The history is provided by the patient. A language interpreter was used.  Abdominal Pain      Prior to Admission medications   Medication Sig Start Date End Date Taking? Authorizing Provider  HYDROcodone -acetaminophen  (NORCO/VICODIN) 5-325 MG tablet Take 1 tablet by mouth every 4 (four) hours as needed for severe pain (pain score 7-10). 12/01/23  Yes Advik Weatherspoon, Margit, PA-C  amLODipine  (NORVASC ) 5 MG tablet Take 5 mg by mouth daily. 10/14/19   [provider]  levothyroxine  (SYNTHROID ) 50 MCG tablet Take 1 tablet (50 mcg total) by mouth daily. 03/04/21   Kassie Mallick, MD  lisinopril -hydrochlorothiazide  (ZESTORETIC ) 20-25 MG tablet Take by mouth. 01/03/21   [provider]  methocarbamol  (ROBAXIN ) 500 MG tablet Take 1 tablet (500 mg total) by mouth 4 (four) times daily. 11/29/21   Sofia, Leslie K, PA-C  polyethylene glycol (MIRALAX  / GLYCOLAX ) 17 g packet Take 17 g by mouth daily. 02/23/20   Tobie Yetta HERO, MD  simethicone  (MYLICON) 80 MG chewable tablet Chew 1 tablet (80 mg total) by mouth 4 (four) times daily. 02/22/20   Tobie Yetta HERO, MD    Allergies: Nsaids and Oxycodone     Review of Systems  Gastrointestinal:  Positive for abdominal pain.    Updated Vital Signs BP 104/64 (BP Location: Right Arm)   Pulse 76   Temp 97.7 F (36.5 C) (Oral)   Resp 16   Ht 5' 6 (1.676 m)   Wt 102.1 kg   SpO2 98%   BMI 36.32 kg/m   Physical Exam Vitals and nursing note  reviewed.  Constitutional:      Appearance: She is well-developed. She is obese.  HENT:     Mouth/Throat:     Mouth: Mucous membranes are moist.  Cardiovascular:     Rate and Rhythm: Normal rate.  Pulmonary:     Effort: Pulmonary effort is normal.  Abdominal:     Comments: No reproducible RUQ abdominal tenderness. Abdomen is non-distended. BS throughout.   Musculoskeletal:        General: Normal range of motion.  Skin:    General: Skin is warm and dry.  Neurological:     Mental Status: She is alert and oriented to person, place, and time.     (all labs ordered are listed, but only abnormal results are displayed) Labs Reviewed  COMPREHENSIVE METABOLIC PANEL WITH GFR - Abnormal; Notable for the following components:      Result Value   Glucose, Bld 103 (*)    Creatinine, Ser 1.07 (*)    GFR, Estimated 57 (*)    All other components within normal limits  URINALYSIS, ROUTINE W REFLEX MICROSCOPIC - Abnormal; Notable for the following components:   Color, Urine AMBER (*)    APPearance HAZY (*)    Ketones, ur 5 (*)    Protein, ur 30 (*)    Leukocytes,Ua MODERATE (*)    Bacteria, UA FEW (*)  All other components within normal limits  LIPASE, BLOOD  CBC    EKG: None  Radiology: CT ABDOMEN PELVIS W CONTRAST Result Date: 12/01/2023 CLINICAL DATA:  Abdominal pain EXAM: CT ABDOMEN AND PELVIS WITH CONTRAST TECHNIQUE: Multidetector CT imaging of the abdomen and pelvis was performed using the standard protocol following bolus administration of intravenous contrast. RADIATION DOSE REDUCTION: This exam was performed according to the departmental dose-optimization program which includes automated exposure control, adjustment of the mA and/or kV according to patient size and/or use of iterative reconstruction technique. CONTRAST:  OMNIPAQUE IOHEXOL 350 MG/ML SOLN COMPARISON:  Ultrasound 12/01/2023 FINDINGS: Lower chest: No acute pleural or parenchymal lung disease. Hepatobiliary: No  focal liver abnormality is seen. No gallstones, gallbladder wall thickening, or biliary dilatation. Pancreas: Unremarkable. No pancreatic ductal dilatation or surrounding inflammatory changes. Spleen: Normal in size without focal abnormality. Adrenals/Urinary Tract: Prior right nephrectomy. The left kidney enhances normally. The adrenals and bladder are unremarkable. Stomach/Bowel: No bowel obstruction or ileus. Normal appendix right lower quadrant. Scattered colonic diverticulosis without diverticulitis. No bowel wall thickening or inflammatory change. Vascular/Lymphatic: Aortic atherosclerosis. No enlarged abdominal or pelvic lymph nodes. Reproductive: Status post hysterectomy. No adnexal masses. Other: No free fluid or free intraperitoneal gas. Postsurgical changes from prior right lateral abdominal wall hernia repair. Musculoskeletal: No acute or destructive bony abnormalities. Reconstructed images demonstrate no additional findings. IMPRESSION: 1. No acute intra-abdominal or intrapelvic process. Normal appendix. 2. Scattered colonic diverticulosis without diverticulitis. 3.  Aortic Atherosclerosis (ICD10-I70.0). Electronically Signed   By: Ozell Daring M.D.   On: 12/01/2023 18:32   US  Abdomen Limited RUQ (LIVER/GB) Result Date: 12/01/2023 EXAM: Right Upper Quadrant Abdominal Ultrasound 12/01/2023 04:09:31 PM CLINICAL HISTORY: Right upper quadrant abdominal pain. Surgical history of right nephrectomy. TECHNIQUE: Real-time ultrasonography of the right upper quadrant of the abdomen was performed. COMPARISON: None available. FINDINGS: LIVER: The liver demonstrates normal echogenicity. No intrahepatic biliary ductal dilatation. No evidence of mass. BILIARY SYSTEM: Gallbladder wall thickness measures 1.8 mm. No pericholecystic fluid. No cholelithiasis. Negative sonographic Murphy's sign. Common bile duct measures 4.2 mm. OTHER: No right upper quadrant ascites. IMPRESSION: 1. No acute right upper quadrant  abnormality detected. Electronically signed by: Lynwood Seip MD 12/01/2023 04:31 PM EDT RP Workstation: [id number]     Procedures   Medications Ordered in the ED  HYDROmorphone (DILAUDID) injection 0.5 mg (0.5 mg Intravenous Given 12/01/23 1710)  ondansetron  (ZOFRAN ) injection 4 mg (4 mg Intravenous Given 12/01/23 1708)  iohexol (OMNIPAQUE) 350 MG/ML injection 100 mL (100 mLs Intravenous Contrast Given 12/01/23 1824)    Clinical Course as of 12/01/23 2002  Sat Dec 01, 2023  1521 Patient with 4 years of intermittent RUQ abdominal pain, constant and severe for the past 3 days. Labs reassuring with no leukocytosis, normal LFT's. No fever. The patient reports it is not tender on palpation but increased with deep breaths.   Will get RUQ US  due to focal location of the pain. Feel CT abd/pel may be required to identify the source of the pain as symptoms do not follow gall bladder type pain. [SU]  1651 Recheck - US  negative. Patient's pain medications delayed. Apologized to the patient and will contact nurse. Will proceed with plan to obtain CT of abd.  [SU]  2000 CT abd/pel per radiology:  IMPRESSION: 1. No acute intra-abdominal or intrapelvic process. Normal appendix. 2. Scattered colonic diverticulosis without diverticulitis. 3.  Aortic Atherosclerosis (ICD10-I70.0).   She is feeling much better with pain medications. Patient requested I update daughter  by phone, who speaks fluent english. All questions answered. She is appropriate for discharge home but as a cause of her pain is not identified she is instructed to follow up with primary care for further outpatient evaluation and management. Pain medication provided.  [SU]    Clinical Course User Index [SU] Odell Balls, PA-C                                 Medical Decision Making Amount and/or Complexity of Data Reviewed Labs: ordered. Radiology: ordered.  Risk Prescription drug management.        Final diagnoses:  Right  upper quadrant abdominal pain    ED Discharge Orders          Ordered    HYDROcodone -acetaminophen  (NORCO/VICODIN) 5-325 MG tablet  Every 4 hours PRN        12/01/23 1911               Odell Balls, DEVONNA 12/01/23 VITO Randol Simmonds, MD 12/02/23 1506

## 2023-12-01 NOTE — ED Triage Notes (Signed)
 Pt came in via POV d/t Rt upper abd pain that has been bothering her the past 4 years & has been worse the last 3 days, endorses she began vomiting today. Has seen her PCP & endorses that she does not hurt when he presses on it because it feels like the pain is from the inside (per pt). A/Ox4, rates her pain 8/10 during triage.

## 2023-12-01 NOTE — ED Notes (Signed)
 Patient in [place].
